# Patient Record
Sex: Female | Born: 1960 | Race: White | Hispanic: No | Marital: Married | Smoking: Never smoker
Health system: Southern US, Academic
[De-identification: ages and names within clinical notes are randomized; demographics above are authoritative.]

## PROBLEM LIST (undated history)

## (undated) DIAGNOSIS — K219 Gastro-esophageal reflux disease without esophagitis: Secondary | ICD-10-CM

## (undated) DIAGNOSIS — K449 Diaphragmatic hernia without obstruction or gangrene: Secondary | ICD-10-CM

## (undated) DIAGNOSIS — G4730 Sleep apnea, unspecified: Secondary | ICD-10-CM

## (undated) DIAGNOSIS — E559 Vitamin D deficiency, unspecified: Secondary | ICD-10-CM

## (undated) DIAGNOSIS — E782 Mixed hyperlipidemia: Secondary | ICD-10-CM

## (undated) HISTORY — DX: Vitamin D deficiency, unspecified: E55.9

## (undated) HISTORY — DX: Gastro-esophageal reflux disease without esophagitis: K21.9

## (undated) HISTORY — DX: Mixed hyperlipidemia: E78.2

## (undated) HISTORY — PX: ENDOMETRIAL ABLATION: SHX621

## (undated) HISTORY — DX: Diaphragmatic hernia without obstruction or gangrene: K44.9

## (undated) HISTORY — PX: UPPER GASTROINTESTINAL ENDOSCOPY: SHX188

## (undated) HISTORY — PX: HX TUBAL LIGATION: SHX77

## (undated) HISTORY — PX: HX ROTATOR CUFF REPAIR: SHX139

## (undated) HISTORY — PX: COLONOSCOPY: WVUENDOPRO10

## (undated) HISTORY — DX: Sleep apnea, unspecified: G47.30

---

## 2003-01-04 ENCOUNTER — Other Ambulatory Visit (HOSPITAL_COMMUNITY): Payer: Self-pay | Admitting: Family Medicine

## 2021-04-21 ENCOUNTER — Other Ambulatory Visit: Payer: Self-pay

## 2021-04-21 ENCOUNTER — Ambulatory Visit (INDEPENDENT_AMBULATORY_CARE_PROVIDER_SITE_OTHER): Payer: 59 | Admitting: NURSE PRACTITIONER

## 2021-04-21 ENCOUNTER — Encounter (INDEPENDENT_AMBULATORY_CARE_PROVIDER_SITE_OTHER): Payer: Self-pay | Admitting: NURSE PRACTITIONER

## 2021-04-21 VITALS — BP 146/72 | HR 71 | Temp 96.4°F | Ht 69.0 in | Wt 212.6 lb

## 2021-04-21 DIAGNOSIS — R718 Other abnormality of red blood cells: Secondary | ICD-10-CM

## 2021-04-21 NOTE — H&P (Signed)
Department of Hematology/Oncology  History and Physical    Name: Cynthia Villanueva  [id number]  Date of Birth: 09/22/1960  Encounter Date: 04/21/2021    REFERRING PROVIDER:  Jodean Lima, [place],  [place] [address]    REASON FOR OFFICE VISIT:  New patient for evaluation and management of  Elevated RBC's and Hematocrit.     HISTORY OF PRESENT ILLNESS:  Cynthia Villanueva is a 61 y.o. female who presents today for elevated RBC's and Hematocrit. Patient states that this has been an issue for over 10 years now. She was diagnosed with Sleep Apnea back in March 2022 and is now using a CPAP. She states that she has chronic back pain and had a bad spider bite a few months ago. She states that she is on a fixed income and since she is having no symptoms and this has been an issue for over 10 years. She is asking if she "really needs to have further testing completed." I discussed with the patient the possible causes of these abnormal labs and recommendations to have testing completed. The patient offers no complaints at this time.  The patient has had a good appetite and stable weight.  The patient has not noted any new areas of disease on own self exam.  The patient has not had any chest pain or dyspnea.  The patient has not had any headaches or changes in vision.  The patient has not had any abdominal pain, nausea, or vomiting.  There have been no changes in bowel or bladder habits.  The patient has not had any abnormal bleeding or clotting episodes.  There have been no complaints of fever, chills, cough, sputum production, dysuria, or diarrhea.    ROS:   Review of Systems   Constitutional: Negative.  Negative for appetite change.   HENT:  Negative.    Eyes: Negative.    Respiratory: Negative.  Negative for shortness of breath.    Cardiovascular: Negative.  Negative for chest pain.   Gastrointestinal: Negative.  Negative for abdominal pain, diarrhea, nausea and vomiting.   Genitourinary: Negative.  Negative  for difficulty urinating.    Musculoskeletal: Negative.    Skin: Negative.    Neurological: Negative.    Hematological: Negative.    Psychiatric/Behavioral: Negative.         History:  History reviewed. No pertinent past medical history.      Past Surgical History:   Procedure Laterality Date   . CESAREAN SECTION     . HX ROTATOR CUFF REPAIR Right    . HX TUBAL LIGATION             Social History     Socioeconomic History   . Marital status: Married     Spouse name: Not on file   . Number of children: Not on file   . Years of education: Not on file   . Highest education level: Not on file   Occupational History   . Not on file   Tobacco Use   . Smoking status: Never   . Smokeless tobacco: Never   Vaping Use   . Vaping Use: Never used   Substance and Sexual Activity   . Alcohol use: Never   . Drug use: Never   . Sexual activity: Not on file   Other Topics Concern   . Not on file   Social History Narrative   . Not on file     Social Determinants of  Health     Financial Resource Strain: Not on file   Food Insecurity: Not on file   Transportation Needs: Not on file   Physical Activity: Not on file   Stress: Not on file   Intimate Partner Violence: Not on file   Housing Stability: Not on file       Social History     Social History Narrative   . Not on file       Social History     Substance and Sexual Activity   Drug Use Never       Family Medical History:     Problem Relation (Age of Onset)    Bone cancer Father    Congestive Heart Failure Maternal Grandmother    Diabetes Maternal Grandmother    Leukemia Mother    Lung Cancer Father            Current Outpatient Medications   Medication Sig   . ascorbic acid (VITAMIN C) 1,000 mg Oral Tablet Take 1 Tablet (1,000 mg total) by mouth Once a day   . calcium carbonate (CALCIUM 600 ORAL) Take by mouth   . cholecalciferol, vitamin D3, (VITAMIN D-3) 50 mcg (2,000 unit) Oral Tablet Take 1 Tablet (2,000 Units total) by mouth Once a day   . famotidine (PEPCID) 40 mg Oral Tablet  Take 1 Tablet (40 mg total) by mouth Once a day   . meloxicam (MOBIC) 15 mg Oral Tablet TAKE 1/2 (ONE-HALF) TABLET BY MOUTH TWICE DAILY AS NEEDED   . multivitamin-minerals-lutein (MULTIVITAMIN 50 PLUS) Oral Tablet Take 1 Tablet by mouth Once a day   . pantoprazole (PROTONIX) 40 mg Oral Tablet, Delayed Release (E.C.) Take 1 Tablet (40 mg total) by mouth Every other day   . pravastatin (PRAVACHOL) 20 mg Oral Tablet Take 1 Tablet (20 mg total) by mouth Once a day       Allergies   Allergen Reactions   . Penicillins Rash         PHYSICAL EXAM:  Most Recent IT sales professional Row Office Visit from 04/21/2021 in Hematology/Oncology, [place], [place]    Temperature 35.8 C (96.4 F) filed at... 04/21/2021 1323   Heart Rate 71 filed at... 04/21/2021 1323   Respiratory Rate --   BP (Non-Invasive) 146/72 filed at... 04/21/2021 1323   SpO2 --   Height 1.753 m (5\' 9" ) filed at... 04/21/2021 1323   Weight 96.4 kg (212 lb 9.6 oz) filed at... 04/21/2021 1323   BMI (Calculated) 31.46 filed at... 04/21/2021 1323   BSA (Calculated) 2.17 filed at... 04/21/2021 1323      ECOG Status: (0) Fully active, able to carry on all predisease performance without restriction   Physical Exam  Vitals and nursing note reviewed.   Constitutional:       Appearance: Normal appearance. She is normal weight.   HENT:      Head: Normocephalic.      Nose: Nose normal.      Mouth/Throat:      Mouth: Mucous membranes are moist.      Pharynx: Oropharynx is clear.   Eyes:      General: No scleral icterus.     Extraocular Movements: Extraocular movements intact.   Cardiovascular:      Rate and Rhythm: Normal rate and regular rhythm.      Pulses: Normal pulses.      Heart sounds: Normal heart sounds.   Pulmonary:  Effort: Pulmonary effort is normal.      Breath sounds: Normal breath sounds.   Abdominal:      General: Bowel sounds are normal.      Palpations: Abdomen is soft.   Musculoskeletal:         General: Normal  range of motion.      Cervical back: Normal range of motion and neck supple.   Skin:     General: Skin is warm and dry.   Neurological:      General: No focal deficit present.      Mental Status: She is alert and oriented to person, place, and time. Mental status is at baseline.   Psychiatric:         Mood and Affect: Mood normal.         Behavior: Behavior normal.         Thought Content: Thought content normal.         Judgment: Judgment normal.          LABS:   Labs completed on 03/10/2021: WBC 6.3, RBC 5.77, HGB 15.4, HCT 47.6, PLT [id number].    ASSESSMENT:    ICD-10-CM    1. Elevated hematocrit  R71.8 COMPREHENSIVE METABOLIC PANEL, NON-FASTING     CBC/DIFF     C-REACTIVE PROTEIN(CRP),INFLAMMATION     JAK2 V617F CASCADING REFL CALR,JAK2 EXON12,MPL,CSF3R      2. Elevated red blood cell count  R71.8 COMPREHENSIVE METABOLIC PANEL, NON-FASTING     CBC/DIFF     C-REACTIVE PROTEIN(CRP),INFLAMMATION     JAK2 V617F CASCADING REFL CALR,JAK2 EXON12,MPL,CSF3R             PLAN:   1. All relative external and internal medical records were reviewed including available H&Ps, progress notes, procedure notes, imaging's, laboratories, and pathology.   2. All labs from last lab studies were reviewed with the patient including CBC/differential, CMP, LFTs. Details of exam finding's discussed.   3. Patient is agreeable to have CBC, CRP, and JAK-2 completed. She has requested to not have a CMP or any other testing at this time.      Arayiah Mancill was given the chance to ask questions, and these were answered to their satisfaction. The patient is welcome to call with any questions or concerns in the meantime.     Return in about 4 weeks (around 05/19/2021).     Kathrin Penner, FNP-BC  04/21/2021, 13:58    CC:  Jodean Lima, MD  [address] [place] [address]    [place], [place], [place],  [place] [address]      This note was partially generated using MModal Fluency Direct system, and there may be some incorrect words,  spellings, and punctuation that were not noted in checking the note before saving.

## 2021-05-26 ENCOUNTER — Ambulatory Visit (INDEPENDENT_AMBULATORY_CARE_PROVIDER_SITE_OTHER): Payer: 59 | Admitting: Family

## 2021-05-26 ENCOUNTER — Other Ambulatory Visit: Payer: Self-pay

## 2021-05-26 ENCOUNTER — Encounter (INDEPENDENT_AMBULATORY_CARE_PROVIDER_SITE_OTHER): Payer: Self-pay | Admitting: Family

## 2021-05-26 VITALS — BP 136/77 | HR 78 | Temp 98.0°F | Ht 69.0 in | Wt 209.8 lb

## 2021-05-26 DIAGNOSIS — Z9989 Dependence on other enabling machines and devices: Secondary | ICD-10-CM

## 2021-05-26 DIAGNOSIS — D751 Secondary polycythemia: Secondary | ICD-10-CM

## 2021-05-26 DIAGNOSIS — G4730 Sleep apnea, unspecified: Secondary | ICD-10-CM

## 2021-05-27 ENCOUNTER — Encounter (INDEPENDENT_AMBULATORY_CARE_PROVIDER_SITE_OTHER): Payer: Self-pay | Admitting: Family

## 2021-05-27 NOTE — Cancer Center Note (Addendum)
Department of Hematology/Oncology  Progress Note    Name: Cynthia Villanueva  KKX:[id number]  Date of Birth: 10/17/1960  Encounter Date: 05/26/2021    REFERRING PROVIDER:  Sherilyn Dacosta, MD  [address]  [place],  [place] [address]    REASON FOR OFFICE VISIT:  Ongoing evaluation and management of erythrocytosis    HISTORY OF PRESENT ILLNESS:  Cynthia Villanueva is a 61 y.o. female who presents today due to erythrocytosis.Patient states that she has been compliant with using her CPAP for her sleep apnea. She denies any other complaints.  The patient has had a good appetite and stable weight. The patient has not had any chest pain or dyspnea.  The patient has not had any headaches or changes in vision.  The patient has not had any abdominal pain, nausea, or vomiting.  There have been no changes in bowel or bladder habits.  The patient has not had any abnormal bleeding or clotting episodes.  There have been no complaints of fever, chills, cough, sputum production, dysuria, or diarrhea.    ROS:   Review of Systems   All other systems reviewed and are negative.       History:  History reviewed. No pertinent past medical history.      Past Surgical History:   Procedure Laterality Date   . CESAREAN SECTION     . HX ROTATOR CUFF REPAIR Right    . HX TUBAL LIGATION         Social History     Socioeconomic History   . Marital status: Married     Spouse name: Not on file   . Number of children: Not on file   . Years of education: Not on file   . Highest education level: Not on file   Occupational History   . Not on file   Tobacco Use   . Smoking status: Never   . Smokeless tobacco: Never   Vaping Use   . Vaping Use: Never used   Substance and Sexual Activity   . Alcohol use: Never   . Drug use: Never   . Sexual activity: Not on file   Other Topics Concern   . Not on file   Social History Narrative   . Not on file     Social Determinants of Health     Financial Resource Strain: Not on file   Transportation Needs: Not on file   Social  Connections: Not on file   Intimate Partner Violence: Not on file   Housing Stability: Not on file       Social History     Social History Narrative   . Not on file       Social History     Substance and Sexual Activity   Drug Use Never       Family Medical History:     Problem Relation (Age of Onset)    Bone cancer Father    Congestive Heart Failure Maternal Grandmother    Diabetes Maternal Grandmother    Leukemia Mother    Lung Cancer Father        Current Outpatient Medications   Medication Sig   . ascorbic acid (VITAMIN C) 1,000 mg Oral Tablet Take 1 Tablet (1,000 mg total) by mouth Once a day   . calcium carbonate (CALCIUM 600 ORAL) Take by mouth   . cholecalciferol, vitamin D3, 50 mcg (2,000 unit) Oral Tablet Take 1 Tablet (2,000 Units total) by mouth Once a day   .  famotidine (PEPCID) 40 mg Oral Tablet Take 1 Tablet (40 mg total) by mouth Once a day   . meloxicam (MOBIC) 15 mg Oral Tablet TAKE 1/2 (ONE-HALF) TABLET BY MOUTH TWICE DAILY AS NEEDED   . multivitamin-minerals-lutein (MULTIVITAMIN 50 PLUS) Oral Tablet Take 1 Tablet by mouth Once a day   . pantoprazole (PROTONIX) 40 mg Oral Tablet, Delayed Release (E.C.) Take 1 Tablet (40 mg total) by mouth Every other day   . pravastatin (PRAVACHOL) 20 mg Oral Tablet Take 1 Tablet (20 mg total) by mouth Once a day       Allergies   Allergen Reactions   . Penicillins Rash       VITAL SIGNS:  Most Recent Vitals    Flowsheet Row Office Visit from 04/21/2021 in Hematology/Oncology, [facility], [facility]    Temperature 35.8 C (96.4 F) filed at... 04/21/2021 1323   Heart Rate 71 filed at... 04/21/2021 1323   Respiratory Rate --   BP (Non-Invasive) 146/72 filed at... 04/21/2021 1323   SpO2 --   Height 1.753 m (5\' 9" ) filed at... 04/21/2021 1323   Weight 96.4 kg (212 lb 9.6 oz) filed at... 04/21/2021 1323   BMI (Calculated) 31.46 filed at... 04/21/2021 1323   BSA (Calculated) 2.17 filed at... 04/21/2021 1323      ECOG Status: (0)  Fully active, able to carry on all predisease performance without restriction       Physical Exam  Vitals and nursing note reviewed.   Constitutional:       Appearance: Normal appearance. She is normal weight.   HENT:      Head: Normocephalic.      Nose: Nose normal.      Mouth/Throat:      Mouth: Mucous membranes are moist.      Pharynx: Oropharynx is clear.   Eyes:      General: No scleral icterus.     Extraocular Movements: Extraocular movements intact.   Cardiovascular:      Rate and Rhythm: Normal rate and regular rhythm.      Pulses: Normal pulses.      Heart sounds: Normal heart sounds, S1 normal and S2 normal.   Pulmonary:      Effort: Pulmonary effort is normal.      Breath sounds: Normal breath sounds.   Abdominal:      General: Bowel sounds are normal.      Palpations: Abdomen is soft.   Musculoskeletal:         General: Normal range of motion.      Cervical back: Normal range of motion and neck supple. No bony tenderness.      Thoracic back: No bony tenderness.      Lumbar back: No bony tenderness.   Lymphadenopathy:      Cervical: No cervical adenopathy.      Comments: No supraclavicular adenopathy   Skin:     General: Skin is warm and dry.   Neurological:      General: No focal deficit present.      Mental Status: She is alert and oriented to person, place, and time. Mental status is at baseline.      Motor: Motor function is intact.      Coordination: Coordination is intact.      Gait: Gait is intact.   Psychiatric:         Mood and Affect: Mood and affect normal.         Speech:  Speech normal.         Behavior: Behavior normal. Behavior is cooperative.         Thought Content: Thought content normal.         Cognition and Memory: Cognition and memory normal.         Judgment: Judgment normal.          LABS:   Labs completed on 04/25/2021: WBC 5.8, RBC 5.67, HGB 15.2, HCT 46.5, PLT 244,000.  JAK2 mutation was negative    ASSESSMENT:    ICD-10-CM    1. Polycythemia  D75.1 CBC/DIFF           PLAN:   1.  All relative external and internal medical records were reviewed including available H&Ps, progress notes, procedure notes, imaging's, laboratories, and pathology.   2. All labs from last lab studies were reviewed with the patient. Details of exam finding's discussed.   3.  Reviewed with patient the importance of being compliant with CPAP and that using the CPAP is slowly improving her erythrocytosis.  She voiced understanding.  We will continue to monitor counts for now.       Cynthia Villanueva was given the chance to ask questions, and these were answered to their satisfaction. The patient is welcome to call with any questions or concerns in the meantime.     Return in about 2 months (around 07/31/2021).     Maxtyn Nuzum ARPN, FNP-C    CC:  Sherilyn Dacosta, MD  [address]  [place] [address]        This note was partially generated using MModal Fluency Direct system, and there may be some incorrect words, spellings, and punctuation that were not noted in checking the note before saving.

## 2021-07-29 ENCOUNTER — Ambulatory Visit: Payer: 59 | Attending: Family | Admitting: Family

## 2021-07-29 ENCOUNTER — Other Ambulatory Visit: Payer: Self-pay

## 2021-07-29 ENCOUNTER — Encounter (INDEPENDENT_AMBULATORY_CARE_PROVIDER_SITE_OTHER): Payer: Self-pay | Admitting: Family

## 2021-07-29 VITALS — BP 128/64 | HR 85 | Temp 98.0°F | Ht 69.0 in | Wt 209.6 lb

## 2021-07-29 DIAGNOSIS — Z808 Family history of malignant neoplasm of other organs or systems: Secondary | ICD-10-CM | POA: Insufficient documentation

## 2021-07-29 DIAGNOSIS — Z806 Family history of leukemia: Secondary | ICD-10-CM | POA: Insufficient documentation

## 2021-07-29 DIAGNOSIS — Z801 Family history of malignant neoplasm of trachea, bronchus and lung: Secondary | ICD-10-CM | POA: Insufficient documentation

## 2021-07-29 DIAGNOSIS — D751 Secondary polycythemia: Secondary | ICD-10-CM | POA: Insufficient documentation

## 2021-07-29 NOTE — Cancer Center Note (Signed)
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 female who presents today due to erythrocytosis.Patient states that she has been compliant with using her CPAP for her sleep apnea. She denies any other complaints.        ROS:   Review of Systems   All other systems reviewed and are negative.     Past Medical History:   Diagnosis Date   . GERD (gastroesophageal reflux disease)    . Mixed hyperlipidemia    . Sleep apnea    . Vitamin D deficiency      Past Surgical History:   Procedure Laterality Date   . CESAREAN SECTION     . HX ROTATOR CUFF REPAIR Right    . HX TUBAL LIGATION       Social History     Socioeconomic History   . Marital status: Married     Spouse name: Not on file   . Number of children: Not on file   . Years of education: Not on file   . Highest education level: Not on file   Occupational History   . Not on file   Tobacco Use   . Smoking status: Never   . Smokeless tobacco: Never   Vaping Use   . Vaping Use: Never used   Substance and Sexual Activity   . Alcohol use: Never   . Drug use: Never   . Sexual activity: Not on file   Other Topics Concern   . Not on file   Social History Narrative   . Not on file     Social Determinants of Health     Financial Resource Strain: Not on file   Transportation Needs: Not on file   Social Connections: Not on file   Intimate Partner Violence: Not on file   Housing Stability: Not on file     Social History     Social History Narrative   . Not on file     Social History     Substance and Sexual Activity   Drug Use Never     Family Medical History:     Problem Relation (Age of Onset)    Bone cancer Father    Congestive Heart Failure Maternal Grandmother     Diabetes Maternal Grandmother    Leukemia Mother    Lung Cancer Father        Current Outpatient Medications   Medication Sig   . ascorbic acid (VITAMIN C) 1,000 mg Oral Tablet Take 1 Tablet (1,000 mg total) by mouth Once a day   . calcium carbonate (CALCIUM 600 ORAL) Take by mouth   . cholecalciferol, vitamin D3, 50 mcg (2,000 unit) Oral Tablet Take 1 Tablet (2,000 Units total) by mouth Once a day   . famotidine (PEPCID) 40 mg Oral Tablet Take 1 Tablet (40 mg total) by mouth Once a day   . meloxicam (MOBIC) 15 mg Oral Tablet TAKE 1/2 (ONE-HALF) TABLET BY MOUTH TWICE DAILY AS NEEDED   . multivitamin-minerals-lutein (MULTIVITAMIN 50 PLUS) Oral Tablet Take 1 Tablet by mouth Once a day   . pantoprazole (PROTONIX) 40 mg Oral Tablet, Delayed  Release (E.C.) Take 1 Tablet (40 mg total) by mouth Every other day   . pravastatin (PRAVACHOL) 20 mg Oral Tablet Take 1 Tablet (20 mg total) by mouth Once a day     Allergies   Allergen Reactions   . Penicillins Rash     VITAL SIGNS:  Most Recent Vitals    Flowsheet Row Office Visit from 04/21/2021 in Hematology/Oncology, [facility], [place]    Temperature 35.8 C (96.4 F) filed at... 04/21/2021 1323   Heart Rate 71 filed at... 04/21/2021 1323   Respiratory Rate --   BP (Non-Invasive) 146/72 filed at... 04/21/2021 1323   SpO2 --   Height 1.753 m (5\' 9" ) filed at... 04/21/2021 1323   Weight 96.4 kg (212 lb 9.6 oz) filed at... 04/21/2021 1323   BMI (Calculated) 31.46 filed at... 04/21/2021 1323   BSA (Calculated) 2.17 filed at... 04/21/2021 1323      ECOG Status: (0) Fully active, able to carry on all predisease performance without restriction.    Physical Exam  Vitals and nursing note reviewed.   Constitutional:       Appearance: Normal appearance. She is normal weight.   HENT:      Head: Normocephalic.      Nose: Nose normal.      Mouth/Throat:      Mouth: Mucous membranes are moist.      Pharynx: Oropharynx is clear.   Eyes:      General:  No scleral icterus.     Extraocular Movements: Extraocular movements intact.   Cardiovascular:      Rate and Rhythm: Normal rate and regular rhythm.      Pulses: Normal pulses.      Heart sounds: Normal heart sounds, S1 normal and S2 normal.   Pulmonary:      Effort: Pulmonary effort is normal.      Breath sounds: Normal breath sounds.   Abdominal:      General: Bowel sounds are normal.      Palpations: Abdomen is soft.   Musculoskeletal:         General: Normal range of motion.      Cervical back: Normal range of motion and neck supple. No bony tenderness.      Thoracic back: No bony tenderness.      Lumbar back: No bony tenderness.   Lymphadenopathy:      Cervical: No cervical adenopathy.      Comments: No supraclavicular adenopathy   Skin:     General: Skin is warm and dry.   Neurological:      General: No focal deficit present.      Mental Status: She is alert and oriented to person, place, and time. Mental status is at baseline.      Motor: Motor function is intact.      Coordination: Coordination is intact.      Gait: Gait is intact.   Psychiatric:         Mood and Affect: Mood and affect normal.         Speech: Speech normal.         Behavior: Behavior normal. Behavior is cooperative.         Thought Content: Thought content normal.         Cognition and Memory: Cognition and memory normal.         Judgment: Judgment normal.          LABS:   Labs from 07/09/21:  WBC 5.8, Hgb 15.4, Hct 46.8, RBC 5.64, Plt 236 K    ASSESSMENT:    ICD-10-CM    1. Polycythemia  D75.1 CBC/DIFF     ERYTHROPOIETIN (EPO), SERUM           PLAN:   1. All relative external and internal medical records were reviewed including available H&Ps, progress notes, procedure notes, imaging's, laboratories, and pathology.   2. All labs from last lab studies were reviewed with the patient. Details of exam finding's discussed.   3.  Reviewed with patient the importance of being compliant with CPAP.  She voiced understanding.  We will continue to  monitor counts for now.       Cynthia Villanueva was given the chance to ask questions, and these were answered to their satisfaction. The patient is welcome to call with any questions or concerns in the meantime.     Return in about 3 months (around 10/28/2021) for In Person Visit.     Cynthia Villanueva ARPN, FNP-C    CC:  Cynthia D [place], DO  [address]  [place] [address]        This note was partially generated using MModal Fluency Direct system, and there may be some incorrect words, spellings, and punctuation that were not noted in checking the note before saving.

## 2021-07-31 ENCOUNTER — Ambulatory Visit (INDEPENDENT_AMBULATORY_CARE_PROVIDER_SITE_OTHER): Payer: Self-pay | Admitting: Hematology & Oncology

## 2021-10-28 ENCOUNTER — Ambulatory Visit (HOSPITAL_COMMUNITY): Payer: Self-pay | Admitting: NURSE PRACTITIONER

## 2021-10-28 ENCOUNTER — Ambulatory Visit: Payer: 59 | Attending: HEMATOLOGY-ONCOLOGY | Admitting: HEMATOLOGY-ONCOLOGY

## 2021-10-28 ENCOUNTER — Encounter (HOSPITAL_COMMUNITY): Payer: Self-pay | Admitting: HEMATOLOGY-ONCOLOGY

## 2021-10-28 ENCOUNTER — Other Ambulatory Visit: Payer: Self-pay

## 2021-10-28 VITALS — BP 150/70 | HR 66 | Temp 98.4°F | Ht 69.0 in | Wt 213.8 lb

## 2021-10-28 DIAGNOSIS — Z808 Family history of malignant neoplasm of other organs or systems: Secondary | ICD-10-CM | POA: Insufficient documentation

## 2021-10-28 DIAGNOSIS — D751 Secondary polycythemia: Secondary | ICD-10-CM | POA: Insufficient documentation

## 2021-10-28 DIAGNOSIS — Z806 Family history of leukemia: Secondary | ICD-10-CM | POA: Insufficient documentation

## 2021-10-28 DIAGNOSIS — Z801 Family history of malignant neoplasm of trachea, bronchus and lung: Secondary | ICD-10-CM | POA: Insufficient documentation

## 2021-10-28 DIAGNOSIS — G4730 Sleep apnea, unspecified: Secondary | ICD-10-CM | POA: Insufficient documentation

## 2021-10-28 DIAGNOSIS — E611 Iron deficiency: Secondary | ICD-10-CM

## 2021-10-28 NOTE — Progress Notes (Signed)
Department of Hematology/Oncology  Progress Note   Name: Cynthia Villanueva  NBV:[id number]  Date of Birth: [birth year]  Encounter Date: 10/28/2021    REFERRING PROVIDER:  Ronnie Derby D, DO  [place],  [place] [address]    REASON FOR OFFICE VISIT:  Polycythemia Vera       HISTORY OF PRESENT ILLNESS:  Cynthia Villanueva is a 61 y.o. female who presents today for follow up of polycythemia.  She has a history of sleep apnea and uses a CPAP machine.  In reviewing her history, the CBC is over the last several years have typically shown her hemoglobin level to range between 14 and 17.1.    ROS:   Pertinent review of systems as discussed in HPI    HISTORY:  Past Medical History:   Diagnosis Date    GERD (gastroesophageal reflux disease)     Mixed hyperlipidemia     Sleep apnea     Vitamin D deficiency          Past Surgical History:   Procedure Laterality Date    CESAREAN SECTION      HX ROTATOR CUFF REPAIR Right     HX TUBAL LIGATION           Social History     Socioeconomic History    Marital status: Married     Spouse name: Not on file    Number of children: Not on file    Years of education: Not on file    Highest education level: Not on file   Occupational History    Not on file   Tobacco Use    Smoking status: Never    Smokeless tobacco: Never   Vaping Use    Vaping Use: Never used   Substance and Sexual Activity    Alcohol use: Never    Drug use: Never    Sexual activity: Not on file   Other Topics Concern    Not on file   Social History Narrative    Not on file     Social Determinants of Health     Financial Resource Strain: Not on file   Transportation Needs: Not on file   Social Connections: Not on file   Intimate Partner Violence: Not on file   Housing Stability: Not on file     Family Medical History:       Problem Relation (Age of Onset)    Bone cancer Father    Congestive Heart Failure Maternal Grandmother    Diabetes Maternal Grandmother    Leukemia Mother    Lung Cancer Father            Current  Outpatient Medications   Medication Sig    ascorbic acid (VITAMIN C) 1,000 mg Oral Tablet Take 1 Tablet (1,000 mg total) by mouth Once a day    calcium carbonate (CALCIUM 600 ORAL) Take by mouth    cholecalciferol, vitamin D3, 50 mcg (2,000 unit) Oral Tablet Take 1 Tablet (2,000 Units total) by mouth Once a day    famotidine (PEPCID) 40 mg Oral Tablet Take 1 Tablet (40 mg total) by mouth Once a day    meloxicam (MOBIC) 15 mg Oral Tablet TAKE 1/2 (ONE-HALF) TABLET BY MOUTH TWICE DAILY AS NEEDED    multivitamin-minerals-lutein (MULTIVITAMIN 50 PLUS) Oral Tablet Take 1 Tablet by mouth Once a day    pantoprazole (PROTONIX) 40 mg Oral Tablet, Delayed Release (E.C.) Take 1 Tablet (40 mg total) by mouth Every other  day    pravastatin (PRAVACHOL) 20 mg Oral Tablet Take 1 Tablet (20 mg total) by mouth Once a day     Allergies   Allergen Reactions    Penicillins Rash       PHYSICAL EXAM:  Most Recent [place] Office Visit from 10/28/2021 in Hematology/Oncology,   [facility]   Temperature 36.9 C (98.4 F) filed at... 10/28/2021 1102   Heart Rate 66 filed at... 10/28/2021 1102   Respiratory Rate --   BP (Non-Invasive) 150/70 filed at... 10/28/2021 1102   SpO2 --   Height 1.753 m (_0 ) filed at... 10/28/2021 1102   Weight 97 kg (213 lb 12.8 oz) filed at... 10/28/2021 1102   BMI (Calculated) 31.64 filed at... 10/28/2021 1102   BSA (Calculated) 2.17 filed at... 10/28/2021 1102      ECOG Status: (0) Fully active, able to carry on all predisease performance without restriction   Physical Exam  Constitutional:       General: She is not in acute distress.     Appearance: Normal appearance.   Eyes:      Extraocular Movements: Extraocular movements intact.   Cardiovascular:      Rate and Rhythm: Normal rate and regular rhythm.   Pulmonary:      Effort: Pulmonary effort is normal.   Abdominal:      General: Abdomen is flat.      Palpations: Abdomen is soft.   Musculoskeletal:         General: Normal  range of motion.      Cervical back: Normal range of motion.   Skin:     General: Skin is warm and dry.   Neurological:      General: No focal deficit present.      Mental Status: She is alert.   Psychiatric:         Mood and Affect: Mood normal.       DIAGNOSTIC DATA:  No results found for this or any previous visit (from the past 17520 hour(s)).    LABS:   CBC  Diff   No results found for: WBC, WBCJ, HGB, HCT, PLTCNT, SEDRATE, ESR, RBC, MCV, MCHC, MCH, RDW, MPV No results found for: PMNS, LYMPHOCYTES, EOSINOPHIL, MONOCYTES, BASOPHILS, PMNABS, LYMPHSABS, EOSABS, MONOSABS, BASOSABS, BASABS         Comprehensive Metabolic Profile    No results found for: SODIUM, POTASSIUM, CHLORIDE, CO2, ANIONGAP, BUN, CREATININE, ALBUMIN, CALCIUM, GLUCOSENF, GLUCOSEFAST, ALKPHOS, ALT, AST, TOTBILIRUBIN, TOTALPROTEIN       BASIC METABOLIC PANEL  No results found for: SODIUM, POTASSIUM, CHLORIDE, CO2, ANIONGAP, BUN, CREATININE, BUNCRRATIO, GFR, CALCIUM, GLUCOSENF        ASSESSMENT:  Problem List Items Addressed This Visit    None     No diagnosis found.     PLAN:   1. All relevant medical records were reviewed including available pertinent provider notes, procedure notes, imaging, laboratory, and pathology.   2. All pertinent labs and/or imaging were reviewed with the patient.   3. Polycythemia:  Her CBC has been very close to normal range, so I did not have any significant concern for an underlying myeloproliferative disorder.  With her history of sleep apnea, it is possible that this could be the main contributing factor to an occasional elevation but it has never been elevated enough to cause concern from my standpoint.  4. Probable iron-deficiency:  The patient's MCV has ranged from 85 down to 83.  Eighty-three in  my experience is consistent with probable iron-deficiency.  She states that she is up-to-date on having colonoscopy exams performed and the deficiency may be related to her regular blood donation.  I have advised her to  take an oral iron supplement once a day.  5. Disposition:  She can see [place] on an as-needed basis going forward    Leasia Swann was given the chance to ask questions, and these were answered to their satisfaction. The patient is welcome to call with any questions or concerns in the meantime.     On the day of the encounter, a total of 35 minutes was spent on this patient encounter including review of historical information, examination, documentation and post-visit activities.   Return if symptoms worsen or fail to improve.     Narda Rutherford, MD  10/28/2021 , 13:07    CC:  Garnette Scheuermann, DO  [place] [address]    [place], [place], [place],  [place] [address]    This note was partially generated using MModal Fluency Direct system, and there may be some incorrect words, spellings, and punctuation that were not noted in checking the note before saving.

## 2021-12-09 ENCOUNTER — Other Ambulatory Visit (HOSPITAL_COMMUNITY): Payer: Self-pay | Admitting: FAMILY PRACTICE

## 2021-12-09 DIAGNOSIS — M25572 Pain in left ankle and joints of left foot: Secondary | ICD-10-CM

## 2021-12-16 ENCOUNTER — Other Ambulatory Visit (HOSPITAL_COMMUNITY): Payer: 59

## 2021-12-18 IMAGING — MR MRI ANKLE LT W WO CONTRAST
9 of 11 series · 33 of 40 positions shown · IV contrast (Gadavist)
Comparison: No prior imaging studies of the ankle are available for comparison.

﻿EXAM:  [id number]   MRI ANKLE LT W WO CONTRAST
INDICATION: 60-year-old with posterior ankle pain for a few months. Burning and swelling.  Diagnosis of tendinitis.  No known history of trauma or previous surgery.
TECHNIQUE: Multiplanar, multisequential MRI of the left ankle was performed, including postcontrast study after injection of 10 mL Gadavist IV.

[Series 7: s-map · sagittal · 3.8mm · 3.75mm/px · 7 of 62 slices shown]
[im 1/62]
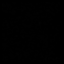
[im 11/62]
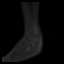
[im 21/62]
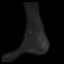
[im 31/62]
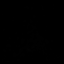
[im 41/62]
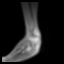
[im 51/62]
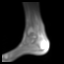
[im 62/62]
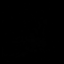

[Series 8: T1 · sagittal · 3.0mm · 0.38mm/px · 2 of 20 slices shown (1 of 3)]
[im 1/20]
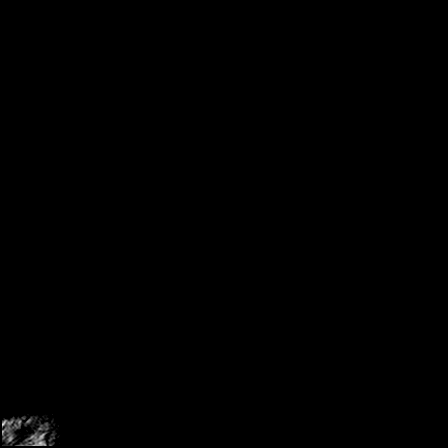
[im 20/20]
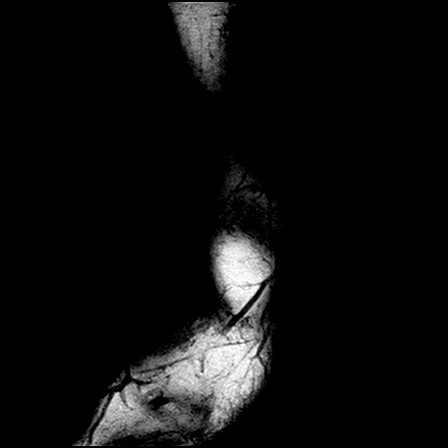

[Series 9: T2 fat-sat · sagittal · 3.0mm · 0.38mm/px · 2 of 20 slices shown (1 of 2)]
[im 1/20]
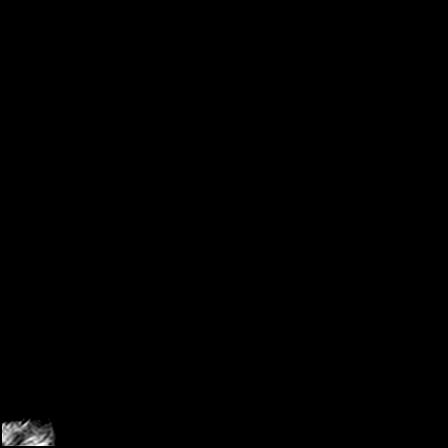
[im 20/20]
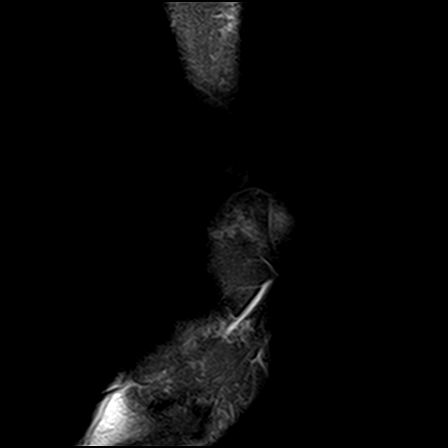

[Series 10: T1 · coronal · 4.0mm · 0.33mm/px · 3 of 26 slices shown (2 of 3)]
[im 1/26]
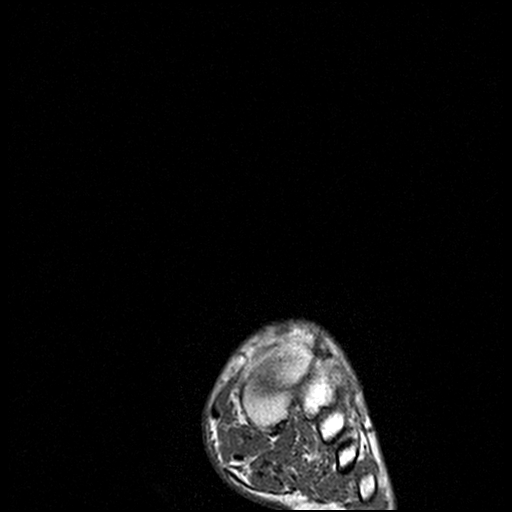
[im 13/26]
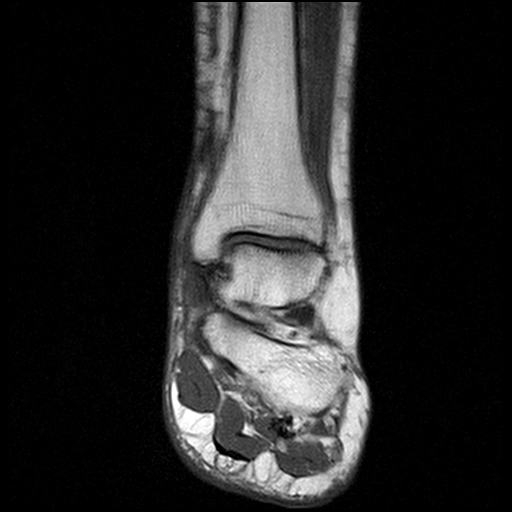
[im 26/26]
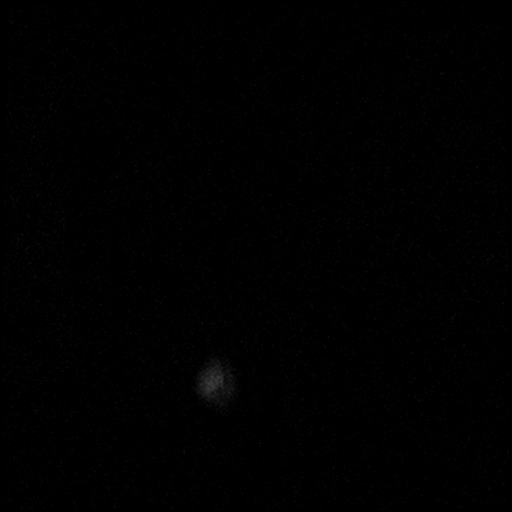

[Series 12: T1 · axial · 5.0mm · 0.33mm/px · z∈[-80,+67]mm · 4 of 28 slices shown (3 of 3)]
[im 1/28]
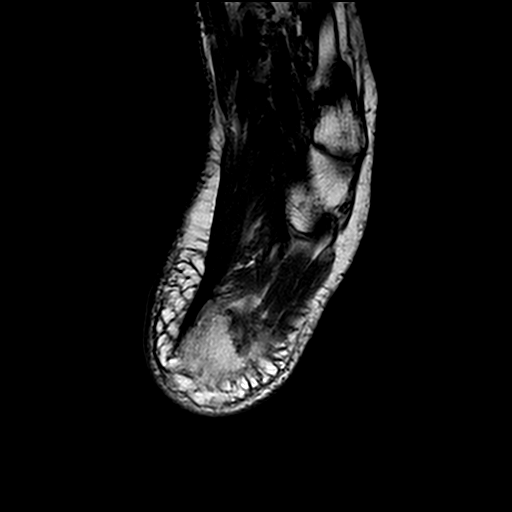
[im 10/28]
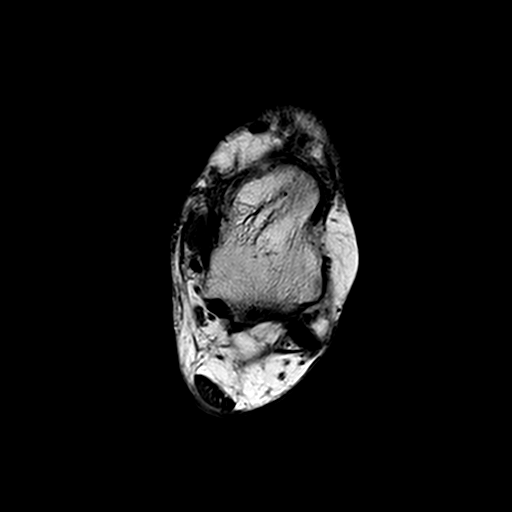
[im 19/28]
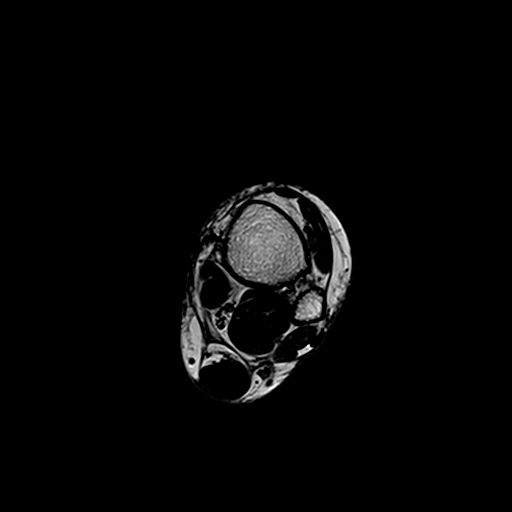
[im 28/28]
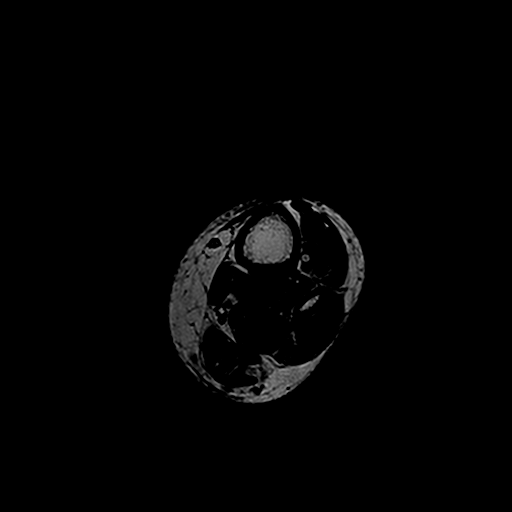

[Series 13: T2 fat-sat · axial · 5.0mm · 0.38mm/px · z∈[-80,+67]mm · 4 of 28 slices shown (2 of 2)]
[im 1/28]
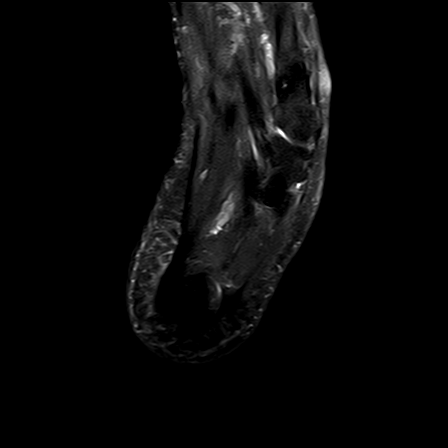
[im 10/28]
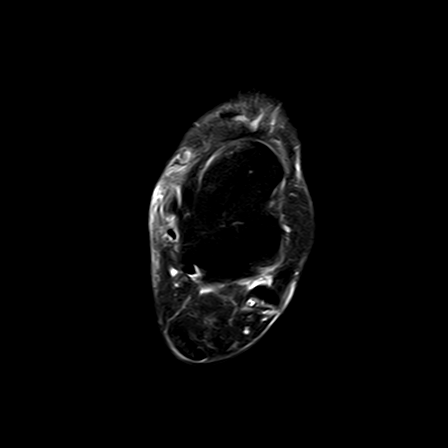
[im 19/28]
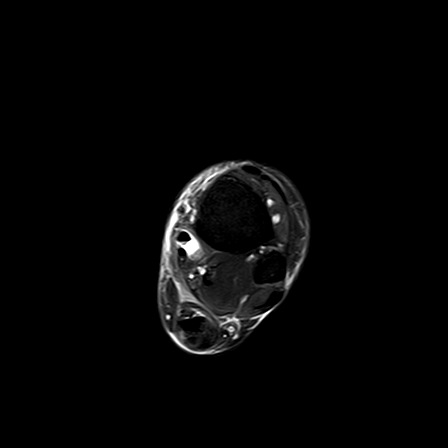
[im 28/28]
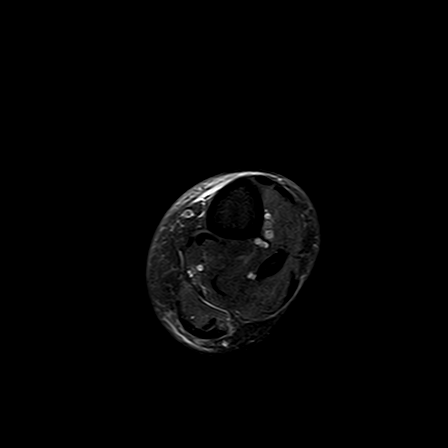

[Series 14: T1 fat-sat · axial · 5.0mm · 0.53mm/px · z∈[-80,+67]mm · 4 of 28 slices shown]
[im 1/28]
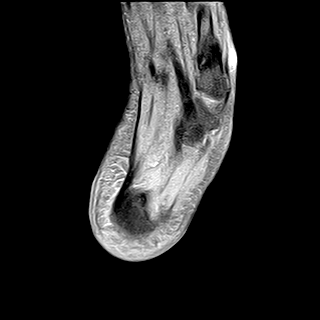
[im 10/28]
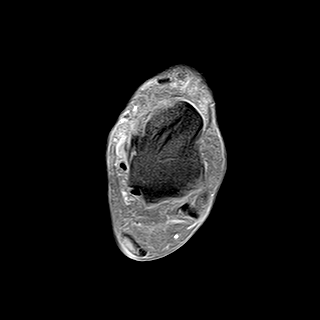
[im 19/28]
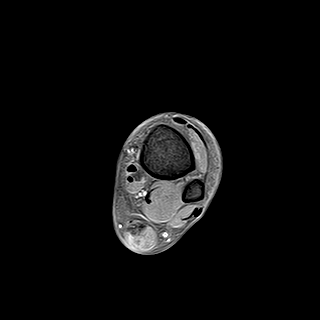
[im 28/28]
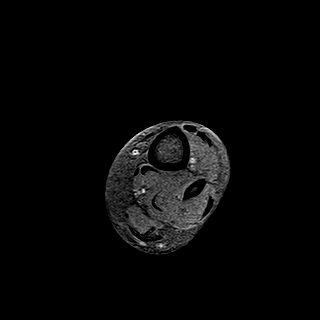

[Series 15: T1 fat-sat post-contrast · sagittal · 3.0mm · 0.33mm/px · 3 of 20 slices shown (1 of 2)]
[im 1/20]
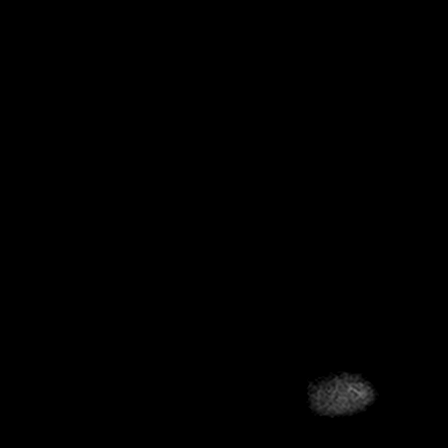
[im 10/20]
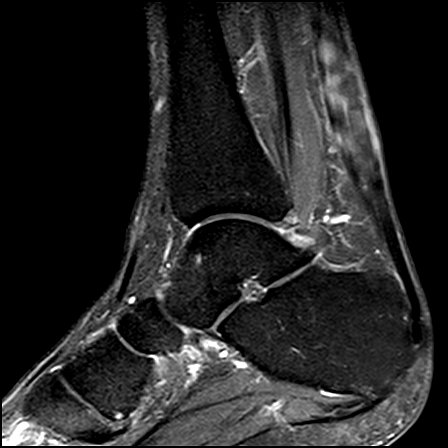
[im 20/20]
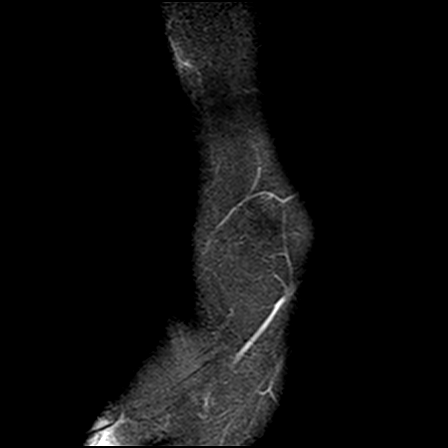

[Series 16: T1 fat-sat post-contrast · axial · 5.0mm · 0.53mm/px · z∈[-80,+67]mm · 4 of 28 slices shown (2 of 2)]
[im 1/28]
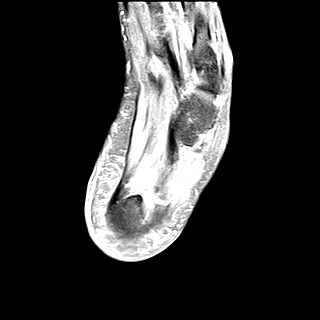
[im 10/28]
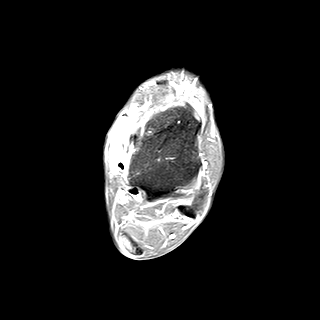
[im 19/28]
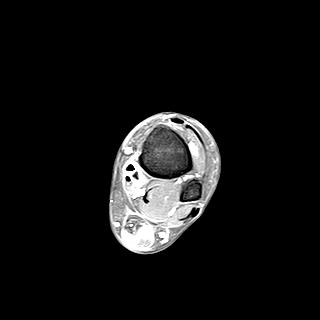
[im 28/28]
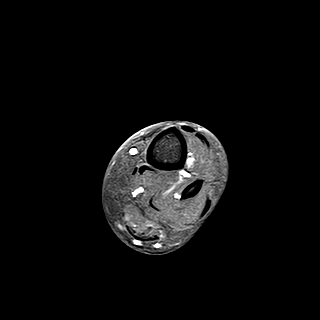

[33 of 40 positions shown; findings below may reference images not displayed]

FINDINGS: No acute bony lesions are seen at the left ankle.  There is no widening of ankle mortise.  Articular surface of the dome of the talus is smooth.  Medial and lateral collateral ligaments of the ankle are intact.  A 5 cm segment of the Achilles tendon is showing abnormal thickening and irregular heterogeneous T2 signal increase.  No abnormal enhancement is noted on the postcontrast study.  The lesion starts approximately 4 cm above the junction of the Achilles tendon with calcaneus.

Remaining tendons of the ankle are intact.  Subcutaneous edema of the anterior medial aspect of lower leg is noted.
IMPRESSION: 1. No acute bone changes at the left ankle.

2. Abnormal appearance of a 5 cm length of Achilles tendon, 4 cm above the junction of the Achilles tendon with the calcaneus as described above with thickening, heterogeneous texture and ill-defined margins.  Findings may represent posttraumatic changes with partial thickness tears. Differential diagnosis would include less likely possibility of neoplasm or infectious process.  Additional evaluation with ultrasound may be of use.  Surgical consultation is recommended.

## 2022-01-27 ENCOUNTER — Ambulatory Visit (HOSPITAL_COMMUNITY)
Admission: RE | Admit: 2022-01-27 | Discharge: 2022-01-27 | Disposition: A | Discharge status: Still In Hospital | Payer: 59 | Source: Ambulatory Visit

## 2022-01-27 ENCOUNTER — Other Ambulatory Visit (HOSPITAL_COMMUNITY): Payer: Self-pay | Admitting: Orthopaedic Surgery

## 2022-01-27 ENCOUNTER — Other Ambulatory Visit: Payer: Self-pay

## 2022-01-27 DIAGNOSIS — M7662 Achilles tendinitis, left leg: Secondary | ICD-10-CM

## 2022-01-27 NOTE — PT Evaluation (Signed)
[facility]  Outpatient Physical Therapy  [place], [address]  [phone]  [phone]      Physical Therapy Lower Extremity Evaluation    Date: 01/27/2022  Patient's Name: Cynthia Villanueva  Date of Birth: 10/18/1960    PT diagnosis/Reason for Referral: Left achilles tendonitis, tendinitis of left posterior tibial tendon               SUBJECTIVE  Date of onset: Approximately June 2023    Mechanism of injury: Patient is unsure about mechanism, however was walking dog on leash when her dog forcefully pulled her 180 degrees. At the time, she did not feel a pull or pop in the achilles area but more so in the back. However this is all she can recall that could have caused pain. She does walk 3 miles a day usually and that can involve quite a bit of inclines.     Previous episodes/treatments: No prior PT sessions, did receive injection into the posterior tibial tendon which did help.     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     Next MD visit: Dr. Lilia Pro 02/13/22    Pain location: Right achilles region                     Pain description:  Burning and shooting pain initially up to the calf and knee. Achiness all the time. After the shot, she has gradually gotten better.     Pain frequency:  INTERMITTENT    Pain rating: Now 0   Best 0   Worst 1    Radiculopathy: No     Pain increases with:  Going up incline and uneven surfaces            decreases with : REST    Sensation: Intact     Weakness: Generalized weakness noted in right ankle with uneven surfaces     Sleep affected: No     Subjective Functional Reports:    Sitting: WFL    Standing: WFL    Walking: LIMITED on uneven surfaces as well as going up inclines     Lifting: WFL      Patient-Specific Functional Score:    Problem Score   1.  Walking uneven surfaces with inclines  9           Total 9           OBJECTIVE    AROM   right left   Ankle DF 15 15   Ankle PF 40 35   Ankle Inversion 40 40   Ankle Eversion 20 10     ROM comments Pain noted with ankle inversion PROM and AROM resistance     Strength     right left   Ankle DF  5 5   Ankle PF  5 4+   Ankle Inversion 5 4   Ankle Eversion 5 4+     Strength comments: Pain with resisted ankle inversion     SLS: Poor stability of bilateral SLS, however demonstrates increased postural sway on left compared to right for about 3-5 seconds.     Gait: NO ASSISTIVE DEVICE, RECIPROCATING STEPS WITH SYMMETRIC STRIDE LENGTH, NARROW BOS, and INITIATION OF GAIT WITHOUT HESITATION  Palpation: Noted mild tenderness along the posterior tibial tendon    Treatment provided:REVIEW OF POC AND GOALS WITH PATIENT, ALL QUESTIONS ANSWERED, PATIENT EDUCATION, and THERAPEUTIC EXERCISE     Access Code: [email or web address]  URL: [email or web address]  Date: 01/27/2022  Prepared by: William Hamburger    Exercises  - Long Sitting Isometric Ankle Inversion in Dorsiflexion with Ball at [place]  - 2 x daily - 7 x weekly - 2-3 sets - 10 reps - 2-3 seconds hold  - Long Sitting Isometric Ankle Eversion in Dorsiflexion with Ball at [employer]  - 2 x daily - 7 x weekly - 2-3 sets - 10 reps - 2-3 seconds hold  - Seated Heel Toe Raises  - 2 x daily - 7 x weekly - 2-3 sets - 10 reps - 2-3 seconds hold  - Seated Ankle Inversion Eversion PROM  - 2 x daily - 7 x weekly - 2-3 sets - 10 reps - 5 seconds hold          ASSESSMENT    Impression: Uilani is a 61 year old female who presents with left posterior tibial tendon tendonitis and achilles pain. She does report since she has had the injection into the left ankle, she is feeling mostly back to baseline except for when she is ambulating in her yard on uneven surfaces and more so with inclines in yard. During assessment today, she does present with mild weakness of the ankle stability groups on left compared to  right as well as mild limitations of ROM into left ankle inversion with increased discomfort. She also presents with poor stability with single leg stance activities, however no increase in pain with SLS. Patient will tolerate continued PT services to progress with functional stability with dynamic activities to maximize return to full function.     Rehab potential: GOOD    Short Term Goals: 3 Weeks   -Increase left ankle AROM and PROM to full range compared to right without increase in pain.    -Patient will improve SLS on left from 3 seconds to 10 seconds or greater on stable surfaces to improve stability of left ankle.    -Patient will tolerate x5 minutes or greater on treadmill incline 2.0-3.0 without increase in pain.    -Patient will be independent in HEP.    Long Term Goals: 6 Weeks   -Patient will be able to tolerate standing on bosu ball for 30 seconds with bilateral UE support with no increase in pain and SBA.    -Patient will tolerate ambulating on uneven surfaces with inclines with no increase in left ankle pain.    -Patient will be independent with left ankle stability exercises to continue progressing with ease of ambulating on uneven surfaces/inclines.         PLAN  Patient will attend 1 times per week x 6 weeks. Therapy may include, but is not limited to THERAPEUTIC EXERCISES, MYOFASCIAL/JOINT MOBILIZATION, POSTURE/BODY MECHANICS, ERGONOMIC TRAINING, TRANSFER/GAIT TRAINING, HOME INSTRUCTIONS, HEAT/COLD, ULTRASOUND, ELECTRICAL STIMULATION, KINESIOTAPE, and NEURO RE-EDUCATIOIN    Plan for next visit Assess tolerance to initial HEP, progress with band exercises and further assess standing balance/stability of the left ankle      Evaluation complexity:   Personal factors impacting POC:  None known    Co-morbidities impacting POC:  None known   Complexity of physical exam: INCLUDING MUSCULOSKELETAL SYSTEM (POSTURE, ROM, STRENGTH, HEIGHT/WEIGHT) and INCLUDING NEUROMUSCULAR EXAM (BALANCE, GAIT, LOCOMOTION,  MOBILITY)   Clinical Presentation: STABLE   Evaluation Complexity: LOW-HISTORY 0,  EXAMINATION 1-2, STABLE PRESENTATION      Total Session Time 35, Timed code minutes 10, and Untimed code minutes 25         Intervention minutes: EVALUATION 25 minutes and THERAPEUTIC EXERCISE 10 minutes    William Hamburger, PT  01/27/2022, 08:29      Start of Service: _________          Certification:    From:______  Through:_________    I certify the need for these services furnished under this plan of treatment and while under my care.    Referring Provider Signature: _______________     Date : _____________________

## 2022-02-04 ENCOUNTER — Ambulatory Visit (HOSPITAL_COMMUNITY)
Admission: RE | Admit: 2022-02-04 | Discharge: 2022-02-04 | Disposition: A | Discharge status: Still In Hospital | Payer: 59 | Source: Ambulatory Visit

## 2022-02-04 ENCOUNTER — Other Ambulatory Visit: Payer: Self-pay

## 2022-02-04 NOTE — PT Treatment (Signed)
[facility]  Outpatient Physical Therapy  [address]  [place], [address]  [phone]  (Fax) [phone]    Physical Therapy Treatment Note    Date: 02/04/2022  Patient's Name: Tyffani Foglesong  Date of Birth: 1960/05/09            Visit #/POC:2 of 8-10  Authorization:20 per cy  POC Signed?: yes  POC Ends: 03/10/22  Next Progress Note Due: 02/27/22      Evaluating Physical Therapist: Louretta Parma, DPT  PT diagnosis/Reason for Referral: L Achilles Tendonitis, L PTT Tendonitis  Next Scheduled Physician Appointment: TBA   Allergies/Contraindications: Penicillins          Subjective: Patient states she is doing well with no pain complaints upon arrival. "I had to wait so long to get in here to start therapy, I'm really not having any pain now." Patient did state that she does notice she has difficulty when she has to walk on the side of a bank.    Objective: Observed ankle and performed palpations to L foot to assess areas of tenderness or lack of motion. She does have a high arch, and callus noted at medial aspect of calcaneus. Patient normally wears a stability shoe, but has recently purchased a light weight, neutral shoe. Educated patient on proper footwear based on her foot mechanics and wear pattern. Did perform balance activities, and patient is most challenged with SLS activities.    Measured ROM: not assessed today, 02/04/22  EXERCISE/ACTIVITY NAME REPETITIONS RESISTANCE COMPLETED THIS DOS   Rockerboard: F/B, S/S, level     15 each; 30 sec  y   Tandem stance, foam    Tandem gait, foam 4 x 15 sec    3 trips  y   SLS 3 x ea  y   SLS 3 object pick up   3 x ea  y                                   Access Code: [facility]  URL: [email or web address]  Date: 02/04/2022  Prepared by: Laurence Aly    Exercises  - Single Leg Stance  - 2 x daily - 7 x weekly - 1 sets - 5 reps    Assessment: Tol session well, CGA and SBA needed during balance activities. Issued handout of SLS  exercise for HEP.    Short Term Goals: 3 Weeks               -Increase left ankle AROM and PROM to full range compared to right without increase in pain.                -Patient will improve SLS on left from 3 seconds to 10 seconds or greater on stable surfaces to improve stability of left ankle.                -Patient will tolerate x5 minutes or greater on treadmill incline 2.0-3.0 without increase in pain.                -Patient will be independent in HEP.     Long Term Goals: 6 Weeks               -Patient will be able to tolerate standing on bosu ball for 30 seconds with bilateral UE support with no increase in pain and SBA.                -  Patient will tolerate ambulating on uneven surfaces with inclines with no increase in left ankle pain.                -Patient will be independent with left ankle stability exercises to continue progressing with ease of ambulating on uneven surfaces/inclines.     Plan: Cont with balance activities.    Total Session Time 40, Timed code minutes 40, and Untimed code minutes 0  THERAPEUTIC EXERCISE 40 minutes      Elinor Parkinson, PTA  02/04/2022, 11:04

## 2022-02-11 ENCOUNTER — Other Ambulatory Visit: Payer: Self-pay

## 2022-02-11 ENCOUNTER — Ambulatory Visit
Admission: RE | Admit: 2022-02-11 | Discharge: 2022-02-11 | Disposition: A | Discharge status: Still In Hospital | Payer: 59 | Source: Ambulatory Visit | Attending: Orthopaedic Surgery | Admitting: Orthopaedic Surgery

## 2022-02-11 NOTE — PT Treatment (Addendum)
[facility]  Outpatient Physical Therapy  [address]  [place], [address]  [phone]  (Fax) [phone]    Physical Therapy Treatment Note    Date: 02/11/2022  Patient's Name: Cynthia Villanueva  Date of Birth: 25-Apr-1960            Visit #/POC:3 of 8-10  Authorization:20 per cy  POC Signed?: yes  POC Ends: 03/10/22  Next Progress Note Due: 02/27/22        Evaluating Physical Therapist: Louretta Parma, DPT  PT diagnosis/Reason for Referral: L Achilles Tendonitis, L PTT Tendonitis  Next Scheduled Physician Appointment: TBA        Allergies/Contraindications: Penicillins              Subjective: Patient states she is doing okay, but does have tenderness above medial malleolus and along PTT. Patient states she has been doing a lot of work outside her yard, and contributes the soreness from that. Mild swelling noted.     Objective: Warm up on Nustep followed by exercises per flowsheet for proprioception and strengthening. Does have difficulty with SLS and tandem positions but is able to correct. Trial of inhibitory taping of posterior tibialis.      Measured ROM: not assessed today, 02/04/22  EXERCISE/ACTIVITY NAME REPETITIONS RESISTANCE COMPLETED THIS DOS   Rockerboard: F/B, S/S, level       15 each; 30 sec   N    Tandem stance, foam     Tandem gait, foam 4 x 15 sec     3 trips   y   SLS 3 x ea   y   SLS 3 object pick up    3 x ea   N    Trampoline tosses: Modified tandem and SLS   10 x ea   Y     MFR to Posterior Tibialis   manual Y      Nustep  5 5 Y                              Assessment: Patient has a RTD appointment on Friday. Patient is considering DC after her appointment, depending on how well her visit goes. 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     Short Term Goals: 3 Weeks               -Increase left ankle AROM and PROM to full range compared to right without increase in pain.                 -Patient will improve SLS on left from 3 seconds to 10 seconds or greater on stable surfaces to improve stability of left ankle.                -Patient will tolerate x5 minutes or greater on treadmill incline 2.0-3.0 without increase in pain.                -Patient will be independent in HEP.     Long Term Goals: 6 Weeks               -Patient will be able to tolerate standing on bosu ball for 30 seconds with bilateral UE support with no increase in pain and SBA.                -  Patient will tolerate ambulating on uneven surfaces with inclines with no increase in left ankle pain.                -Patient will be independent with left ankle stability exercises to continue progressing with ease of ambulating on uneven surfaces/inclines.      Plan: Await from MD on DC orders if necessary.       Total Session Time 40, Timed code minutes 40, and Untimed code minutes 0  THERAPEUTIC EXERCISE 40 minutes      Laurence Aly, PTA  02/11/2022, 10:24      PATIENT ATTENDED 3 PT VISITS BETWEEN 01/27/22 AND 02/11/22 WITH DX OF LBP. THERAPY INCLUDED PATIENT EDUCATION, EXERCISES FOR MOBILITY/STRENGTHENING/ENDURANCE AND MFR. AS OF 01/07/22. WE HAVE NOT HEARD BACK REGARDING CONTINUED PHYSICAL THERAPY; THEREFORE, PATIENT IS D/C AT THIS TIME.   Charlestine Massed, PT  03/19/2022 15:06

## 2022-02-18 ENCOUNTER — Ambulatory Visit (HOSPITAL_COMMUNITY): Payer: Self-pay

## 2022-05-12 ENCOUNTER — Other Ambulatory Visit: Payer: Self-pay

## 2022-05-12 ENCOUNTER — Ambulatory Visit (INDEPENDENT_AMBULATORY_CARE_PROVIDER_SITE_OTHER): Payer: 59 | Admitting: Surgery

## 2022-05-12 ENCOUNTER — Encounter (INDEPENDENT_AMBULATORY_CARE_PROVIDER_SITE_OTHER): Payer: Self-pay | Admitting: Surgery

## 2022-05-12 VITALS — BP 136/63 | HR 76 | Temp 98.2°F | Ht 69.0 in | Wt 216.0 lb

## 2022-05-12 DIAGNOSIS — K449 Diaphragmatic hernia without obstruction or gangrene: Secondary | ICD-10-CM

## 2022-05-12 DIAGNOSIS — K219 Gastro-esophageal reflux disease without esophagitis: Secondary | ICD-10-CM

## 2022-05-12 DIAGNOSIS — Z86010 Personal history of colonic polyps: Secondary | ICD-10-CM

## 2022-05-12 MED ORDER — SUTAB 1.479-0.188-0.225 GRAM TABLET
ORAL_TABLET | ORAL | 0 refills | Status: DC
Start: 2022-05-12 — End: 2022-07-22

## 2022-05-15 ENCOUNTER — Encounter (INDEPENDENT_AMBULATORY_CARE_PROVIDER_SITE_OTHER): Payer: Self-pay | Admitting: Surgery

## 2022-05-15 NOTE — Progress Notes (Signed)
GENERAL SURGERY, [facility]  [address]  [place] [address]    History and Physical     Name: Cynthia Villanueva MRN:  [id number]   Date: 05/12/2022 Age: 62 y.o.            Reason for Visit: Colonoscopy and EGD    History of Present Illness  [name] presents today EGD and colonoscopy because of hiatal hernia with GERD and personal history of polyps.    Negative diabetes, blood thinner, family history colon cancer    Review of the result(s) of each unique test:  Patient underwent diagnostic testing ( none ) prior to this dates visit.  I have personally reviewed the results and that serves as a component of the medical decision making for this encounter       Review of prior external note(s) from each unique source:  Patients referral to this office including a recent assessment by the referring provider.  This was reviewed by me for this unique office visit for the indication and intent of the referral as well as any pertinent medical or surgical history relevant to the patients independent evaluation by me today.      Patient History  Past Medical History:   Diagnosis Date    GERD (gastroesophageal reflux disease)     Hiatal hernia     Mixed hyperlipidemia     Sleep apnea     Vitamin D deficiency          Past Surgical History:   Procedure Laterality Date    CESAREAN SECTION      HX ROTATOR CUFF REPAIR Right     HX TUBAL LIGATION           Current Outpatient Medications   Medication Sig    ascorbic acid (VITAMIN C) 1,000 mg Oral Tablet Take 1 Tablet (1,000 mg total) by mouth Once a day    calcium carbonate (CALCIUM 600 ORAL) Take by mouth    cholecalciferol, vitamin D3, 50 mcg (2,000 unit) Oral Tablet Take 1 Tablet (2,000 Units total) by mouth Once a day    famotidine (PEPCID) 40 mg Oral Tablet Take 1 Tablet (40 mg total) by mouth Once a day    multivitamin-minerals-lutein (MULTIVITAMIN 50 PLUS) Oral Tablet Take 1 Tablet by mouth Once a day    pravastatin (PRAVACHOL) 20 mg Oral  Tablet Take 1 Tablet (20 mg total) by mouth Once a day    sod sulf-pot chloride-mag sulf (SUTAB) 1.479-0.188- 0.225 gram Oral Tablet Take 12 tablets with specified amount of water the evening prior to colonoscopy, as directed on the package. Take an additional 12 tablets with specified amount of water the morning of the colonoscopy, as directed on the package. Complete all tablets and required water at least 2 hours prior to procedure.     Allergies   Allergen Reactions    Penicillins Rash     Family Medical History:       Problem Relation (Age of Onset)    Bone cancer Father    Congestive Heart Failure Maternal Grandmother    Diabetes Maternal Grandmother    Leukemia Mother    Lung Cancer Father            Social History     Tobacco Use    Smoking status: Never    Smokeless tobacco: Never   Vaping Use    Vaping Use: Never used   Substance Use Topics    Alcohol use:  Never    Drug use: Never            Physical Examination:  Vitals:    05/12/22 1553   BP: 136/63   Pulse: 76   Temp: 36.8 C (98.2 F)   SpO2: 95%   Weight: 98 kg (216 lb)   Height: 1.753 m (5' 9"$ )   BMI: 31.96        General: appropriate for age. in no acute distress.    Vital signs are present above and have been reviewed by me     HEENT: Atraumatic, Normocephalic. PERRLA. EOMI. Nose clear. Throat clear    Lungs: Nonlabored breathing with symmetric expansion. Clear to auscultation bilaterally    Heart:Regular wth respect to rate and rythmn.    Abdomen:Soft. Nontender. Nondistended and benign    Extremities: Grossly normal. No major deformities     Neuro:  Grossly normal motor and sensory function    Psychiatric: Alert and oriented to person, place, and time. affect appropriate      Assessment and Plan  EGD and colonoscopy scheduled for 07/22/2022 at 11 o'clock      Follow Up:  No follow-ups on file.      ICD-10-CM    1. Hiatal hernia with gastroesophageal reflux  K44.9     K21.9       2. Personal history of colonic polyps  Z86.010           Cynthia Villanueva  Brynli Ollis, MD ,MBA,FACS    I appreciate the opportunity to be involved in the care of your patients.  If you have any questions or concerns regarding this encounter, please do not hesitate to contact me at your convenience.      This note may have been partially generated using MModal Fluency Direct system, and there may be some incorrect words, spellings, and punctuation that were not noted in checking the note before saving, though effort was made to avoid such errors.

## 2022-06-17 ENCOUNTER — Other Ambulatory Visit (HOSPITAL_COMMUNITY): Payer: Self-pay | Admitting: FAMILY PRACTICE

## 2022-06-17 ENCOUNTER — Encounter: Payer: Self-pay | Admitting: FAMILY PRACTICE

## 2022-06-17 DIAGNOSIS — Z1231 Encounter for screening mammogram for malignant neoplasm of breast: Secondary | ICD-10-CM

## 2022-06-22 ENCOUNTER — Inpatient Hospital Stay
Admission: RE | Admit: 2022-06-22 | Discharge: 2022-06-22 | Disposition: A | Discharge status: Home / Self Care | Payer: 59 | Source: Ambulatory Visit | Attending: FAMILY PRACTICE | Admitting: FAMILY PRACTICE

## 2022-06-22 ENCOUNTER — Encounter (HOSPITAL_COMMUNITY): Payer: Self-pay

## 2022-06-22 ENCOUNTER — Other Ambulatory Visit: Payer: Self-pay

## 2022-06-22 DIAGNOSIS — Z1231 Encounter for screening mammogram for malignant neoplasm of breast: Secondary | ICD-10-CM

## 2022-07-22 ENCOUNTER — Ambulatory Visit (HOSPITAL_COMMUNITY): Payer: 59 | Admitting: Certified Registered"

## 2022-07-22 ENCOUNTER — Encounter (HOSPITAL_COMMUNITY): Payer: 59 | Admitting: Surgery

## 2022-07-22 ENCOUNTER — Inpatient Hospital Stay
Admission: RE | Admit: 2022-07-22 | Discharge: 2022-07-22 | Disposition: A | Discharge status: Home / Self Care | Payer: 59 | Source: Ambulatory Visit | Attending: Surgery | Admitting: Surgery

## 2022-07-22 ENCOUNTER — Other Ambulatory Visit: Payer: Self-pay

## 2022-07-22 ENCOUNTER — Encounter (HOSPITAL_COMMUNITY): Payer: Self-pay | Admitting: Surgery

## 2022-07-22 ENCOUNTER — Encounter (HOSPITAL_COMMUNITY)
Admission: RE | Disposition: A | Discharge status: Home / Self Care | Payer: Self-pay | Source: Ambulatory Visit | Attending: Surgery

## 2022-07-22 DIAGNOSIS — Z9989 Dependence on other enabling machines and devices: Secondary | ICD-10-CM | POA: Insufficient documentation

## 2022-07-22 DIAGNOSIS — K2950 Unspecified chronic gastritis without bleeding: Secondary | ICD-10-CM | POA: Insufficient documentation

## 2022-07-22 DIAGNOSIS — K219 Gastro-esophageal reflux disease without esophagitis: Secondary | ICD-10-CM | POA: Insufficient documentation

## 2022-07-22 DIAGNOSIS — E039 Hypothyroidism, unspecified: Secondary | ICD-10-CM | POA: Insufficient documentation

## 2022-07-22 DIAGNOSIS — Z86010 Personal history of colonic polyps: Secondary | ICD-10-CM | POA: Insufficient documentation

## 2022-07-22 DIAGNOSIS — K449 Diaphragmatic hernia without obstruction or gangrene: Secondary | ICD-10-CM

## 2022-07-22 DIAGNOSIS — Z1211 Encounter for screening for malignant neoplasm of colon: Secondary | ICD-10-CM

## 2022-07-22 DIAGNOSIS — G4730 Sleep apnea, unspecified: Secondary | ICD-10-CM | POA: Insufficient documentation

## 2022-07-22 DIAGNOSIS — E782 Mixed hyperlipidemia: Secondary | ICD-10-CM | POA: Insufficient documentation

## 2022-07-22 SURGERY — GASTROSCOPY WITH BIOPSY
Anesthesia: General | Wound class: Clean Contaminated Wounds-The respiratory, GI, Genital, or urinary

## 2022-07-22 MED ORDER — PROPOFOL 10 MG/ML IV BOLUS
INJECTION | Freq: Once | INTRAVENOUS | Status: DC | PRN
Start: 2022-07-22 — End: 2022-07-22
  Administered 2022-07-22 (×2): 100 mg via INTRAVENOUS

## 2022-07-22 MED ORDER — DEXTROSE 5 % AND LACTATED RINGERS INTRAVENOUS SOLUTION
INTRAVENOUS | Status: DC | PRN
Start: 2022-07-22 — End: 2022-07-22
  Administered 2022-07-22: 0 via INTRAVENOUS

## 2022-07-22 MED ORDER — LIDOCAINE (PF) 100 MG/5 ML (2 %) INTRAVENOUS SYRINGE
INJECTION | Freq: Once | INTRAVENOUS | Status: DC | PRN
Start: 2022-07-22 — End: 2022-07-22
  Administered 2022-07-22: 100 mg via INTRAVENOUS

## 2022-07-22 SURGICAL SUPPLY — 4 items
CLEANER INSTRUMENT PRE-KLENZ 13.5 OZ (MISCELLANEOUS PT CARE ITEMS) ×1 IMPLANT
FORCEPS BIOPSY MICROMESH TTH STREAMLINE CATH NEEDLE 240CM 2.4MM RJ 4 SS LRG CPC STRL DISP ORNG 2.8MM (ENDOSCOPIC SUPPLIES) ×1 IMPLANT
USE ITEM 60432 FORCEPS BIOPSY MICROMESH TTH STREAMLINE CATH NEEDLE 240CM 2.4MM RJ 4 SS LRG CPC STRL DISP ORNG 2.8MM (ENDOSCOPIC SUPPLIES) ×1 IMPLANT
VALVE AIR/H20 DEFENDO BUTTON KIT SUCT BIOPSY STRL DISP (ENDOSCOPIC SUPPLIES) ×1 IMPLANT

## 2022-07-22 NOTE — Anesthesia Preprocedure Evaluation (Signed)
ANESTHESIA PRE-OP EVALUATION  Planned Procedure: EGD WITH BIOPSY  COLONOSCOPY  Review of Systems     anesthesia history negative     patient summary reviewed  nursing notes reviewed        Pulmonary   sleep apnea and CPAP,   Cardiovascular  negative cardio ROS,   ECG reviewed and hyperlipidemia , Exercise Tolerance: > or = 4 METS        GI/Hepatic/Renal    hiatal hernia and GERD        Endo/Other    obesity and drug induced coagulopathy,      Neuro/Psych/MS   negative neuro/psych ROS,      Cancer    negative hematology/oncology ROS,               Physical Assessment      Airway       Mallampati: II    TM distance: <3 FB    Mouth Opening: good.            Dental       Dentition intact             Pulmonary    Breath sounds clear to auscultation       Cardiovascular    Rhythm: regular  Rate: Normal       Other findings          Plan  ASA 2     Planned anesthesia type: general     general intravenous            SLEEP APNEA  Patient is at risk of obstructive sleep apnea and Education provided regarding risk of obstructive sleep apnea        Intravenous induction       Anesthetic plan and risks discussed with patient  signed consent obtained          Patient's NPO status is appropriate for Anesthesia.

## 2022-07-22 NOTE — Anesthesia Postprocedure Evaluation (Signed)
Anesthesia Post Op Evaluation    Patient: Cynthia Villanueva  Procedure(s):  EGD WITH BIOPSY  COLONOSCOPY    Last Vitals:Temperature: 36.6 C (97.9 F) (07/22/22 1039)  Heart Rate: 70 (07/22/22 1039)  BP (Non-Invasive): 129/68 (07/22/22 1039)  Respiratory Rate: 20 (07/22/22 1039)  SpO2: 97 % (07/22/22 1039)    No notable events documented.    Patient is sufficiently recovered from the effects of anesthesia to participate in the evaluation and has returned to their pre-procedure level.  Patient location during evaluation: PACU       Patient participation: complete - patient participated  Level of consciousness: awake and alert and responsive to verbal stimuli    Pain management: adequate  Airway patency: patent    Anesthetic complications: no  Cardiovascular status: acceptable  Respiratory status: acceptable  Hydration status: acceptable  Patient post-procedure temperature: Pt Normothermic   PONV Status: Absent

## 2022-07-22 NOTE — OR Surgeon (Signed)
[facility]      Patient Name: Cynthia Villanueva, Cynthia Villanueva Number: [id number]  Date of Service: 07/22/2022   Date of Birth: [birth year]      Pre-Operative Diagnosis: GERD  HIATAL HERNIA  PERSONAL HISTORY OF POLYPS     Post-Operative Diagnosis: Mild Gastritis   Grade B Esophagitis  Normal Colonoscopy    Procedure(s)/Description:  EGD WITH BIOPSY: 43239 (CPT)  COLONOSCOPY: 54098 (CPT)     Attending Surgeon: Fidela Juneau, MD     Anesthesia:  CRNA: Magdalene Patricia, CRNA    Anesthesia Type: .General     Specimens Removed:   ID Type Source Tests Collected by Time Destination   1 : Gatric Antrum Bx x1 Tissue Gastric SURGICAL PATHOLOGY SPECIMEN Jadier Rockers, Cherylann Parr, MD 07/22/2022 1115      Order Name Source Comment Collection Info Order Time   SURGICAL PATHOLOGY SPECIMEN Gastric Pre-op diagnosis:  GERDHIATAL HERNIA  PERSONAL HISTORY OF POLYPS Collected By: Fidela Juneau, MD 07/22/2022 11:22 AM     Release to patient   Automated            The patient indicates that they have read and understood the preoperative EGD with biopsy and colonoscopy consent form. The benefits, risks and alternatives to the procedure were discussed. I specifically discussed the risk of bleeding and/or perforation requiring operation.The patient indicates they have no further question and wish to proceed. Informed consent was obtained from the patient and/or medical power of attorney.  The patient was brought in to the endoscopy suite and placed on the stretcher in the left lateral decubitus position. The video gastroscope was then inserted into the mouth, down the esophagus and into the stomach after adequate IV and topical anesthetic was provided. The stomach was insufflated with air and examination of the stomach was performed. Antral biopsy was obtained and sent to the Pathology department for microscopic examination. Hemostasis was well obtained.  The scope was advanced to the pyloric channel.  The pylorus was cannulated and  the scope was advanced into the duodenum without any difficulty. Examination of the first and second portions of the duodenum was performed and the findings were noted as above. The scope was withdrawn back into the stomach in which an extensive examination was performed with the above noted findings. Retroflexion of the scope was performed which gave good visualization of the proximal stomach and GE junction from below. The scope was pulled back up into the proximal stomach and GE junction, with the above noted findings.  The scope was withdrawn back up into the esophagus and out the mouth without any difficulty. The patient tolerated the procedure well. No complications were encountered.   There were no unplanned events.  EKG, pulse, pulse oximetry and blood pressure were monitored throughout the entire procedure.  The patient was positioned on the stretcher in the left lateral decubitus position. After IV sedation was given, full finger digital rectal examination was performed with a circumferential sweep of the distal rectal mucosa. Subsequently, the flexible colonoscope was inserted into the rectum and passed without any difficulty. The colonoscope was then advanced up into the sigmoid colon, descending colon, transverse colon, right colon and cecum without any difficulty. Gross examination of each section of the colon was performed. Cecal intubation was achieved and the appendiceal orifice and ileocecal valve were identified. The operative findings were noted as described above. The colonoscope was withdrawn carefully examining the mucosa as the scope was being extracted with particular attention paid to  the proximal sides of folds, flexures, bends and rectal valves. At approximately 10 cm. from the anal verge, the colonoscope was retroflexed to fully examine the distal rectum. The colonoscope was removed and a repeat digital rectal examination was performed at the completion of the procedure. The patient  tolerated the procedure well. No intraoperative complications were encountered.  EKG, pulse, pulse oximetry and blood pressure were monitored throughout the entire procedure.  There were no unplanned events.    The patient will not need another screening colonoscopy for 10 years which is according to ASGE guidelines. However, if in the future the patient has any problems with abdominal pain, changes in bowel habits, blood in stool, etc., then they should contact me because they may be a candidate for diagnostic colonoscopy before the 10 year time limit.    The patient was instructed to contact me if they have any problems with their stomach and/or colon such as nausea, vomiting, bleeding, pain or changes in bowel habits. They understood and agreed to do so.  Aubryanna Nesheim B. Grecia Lynk, MD, MBA, FACS  [facility] -General Surgery

## 2022-07-22 NOTE — Discharge Instructions (Signed)
SURGICAL DISCHARGE INSTRUCTIONS     Dr. Donnal Debar, Gene B, MD  performed your EGD WITH BIOPSY, COLONOSCOPY today at the [facility]    [place]  [facility]:  Monday through Friday from 8 a.m. - 4 p.m.: [phone]    For T&D: [phone]  Between 4 p.m. - 8 a.m., weekends and holidays:  Call ER [phone]    PLEASE SEE WRITTEN HANDOUTS AS DISCUSSED BY YOUR NURSE:  findings today:  Mild Gastritis   Grade B Esophagitis  Normal Colonoscopy     SIGNS AND SYMPTOMS OF A WOUND / INCISION INFECTION   Be sure to watch for the following:  Increase in redness or red streaks near or around the wound or incision.  Increase in pain that is intense or severe and cannot be relieved by the pain medication that your doctor has given you.  Increase in swelling that cannot be relieved by elevation of a body part, or by applying ice, if permitted.  Increase in drainage, or if yellow / green in color and smells bad. This could be on a dressing or a cast.  Increase in fever for longer than 24 hours, or an increase that is higher than 101 degrees Fahrenheit (normal body temperature is 98 degrees Fahrenheit). The incision may feel warm to the touch.    **CALL YOUR DOCTOR IF ONE OR MORE OF THESE SIGNS / SYMPTOMS SHOULD OCCUR.    ANESTHESIA INFORMATION   ANESTHESIA -- ADULT PATIENTS:  You have received intravenous sedation / general anesthesia, and you may feel drowsy and light-headed for several hours. You may even experience some forgetfulness of the procedure. DO NOT DRIVE A MOTOR VEHICLE or perform any activity requiring complete alertness or coordination until you feel fully awake in about 24-48 hours. Do not drink alcoholic beverages for at least 24 hours. Do not stay alone, you must have a responsible adult available to be with you. You may also experience a dry mouth or nausea for 24 hours. This is a normal side effect and will disappear as the effects of the medication wear off.    REMEMBER   If you  experience any difficulty breathing, chest pain, bleeding that you feel is excessive, persistent nausea or vomiting or for any other concerns:  Call your physician Dr.  Donnal Debar, Cherylann Parr, MD   at [phone] . You may also ask to have the general doctor on call paged. They are available to you 24 hours a day.      SPECIAL INSTRUCTIONS / COMMENTS   Findings today:  Mild Gastritis   Grade B Esophagitis  Normal Colonoscopy   Rest today. No driving, working, cooking, Designer, television/film set today. Resume normal activity tomorrow.      FOLLOW-UP APPOINTMENTS   Please call your surgeon's office at the number listed to schedule a date / time of return for follow-up as needed.

## 2022-07-22 NOTE — H&P (Addendum)
Cynthia Villanueva  General Surgery  History and Physical    Date of Service:  07/22/2022  Cynthia Villanueva, 62 y.o. female  Date of Admission:  (Not on file)  Date of Birth:  Nov 26, 1960  PCP: Cynthia Ghazi, DO    Reason for admission:  EGD and colonoscopy    HPI:  Cynthia Villanueva is a 62 y.o. White female who is admitted for GERD  HIATAL HERNIA  PERSONAL HISTORY OF POLYPS     Cynthia Villanueva presents today EGD and colonoscopy because of hiatal hernia with GERD and personal history of polyps.     Negative diabetes, blood thinner, family history colon cancer     Review of the result(s) of each unique test:  Patient underwent diagnostic testing ( none ) prior to this dates visit.  I have personally reviewed the results and that serves as a component of the medical decision making for this encounter        Review of prior external note(s) from each unique source:  Patients referral to this office including a recent assessment by the referring provider.  This was reviewed by me for this unique office visit for the indication and intent of the referral as well as any pertinent medical or surgical history relevant to the patients independent evaluation by me today.       Past Medical History:   Diagnosis Date    GERD (gastroesophageal reflux disease)     Hiatal hernia     Mixed hyperlipidemia     Sleep apnea     Vitamin D deficiency       Past Surgical History:   Procedure Laterality Date    CESAREAN SECTION      HX ROTATOR CUFF REPAIR Right     HX TUBAL LIGATION        Social History     Tobacco Use    Smoking status: Never    Smokeless tobacco: Never   Vaping Use    Vaping status: Never Used   Substance Use Topics    Alcohol use: Never    Drug use: Never       Family Medical History:       Problem Relation (Age of Onset)    Bone cancer Father    Congestive Heart Failure Maternal Grandmother    Diabetes Maternal Grandmother    Leukemia Mother    Lung Cancer Father    No Known Problems Sister, Brother, Maternal  Grandfather, Paternal Grandmother, Paternal Grandfather, Daughter, Son, Maternal Aunt, Maternal Uncle, Paternal Aunt, Paternal Uncle, Other            Cannot display prior to admission medications because the patient has not been admitted in this contact.           Allergies   Allergen Reactions    Penicillins Rash          No data found.       General: appropriate for age. in no acute distress.    Vital signs are present above and have been reviewed by me     HEENT: Atraumatic, Normocephalic. PERRLA, EOMI. Nose clear. Throat clear.    Lungs: Nonlabored breathing with symmetric expansion.  Clear to auscultation bilaterally    Heart:Regular wth respect to rate and rythmn.    Abdomen:Soft. Nontender. Nondistended and benign    Extremities:  Grossly normal with good range of motion and no major deformities.    Neuro:  Grossly normal motor and sensory function. CN's  II through XII intact.    Psychiatric: Alert and oriented to person, place, and time. affect appropriate    Laboratory Data:     No results found for any visits on 07/22/22 (from the past 24 hour(s)).    Imaging Studies:    No orders to display        Assessment/Plan:  GERD  HIATAL HERNIA  PERSONAL HISTORY OF POLYPS    EGD and colonoscopy scheduled for Wednesday July 22, 2022     Discussed indications, risks and benefits of esophagogastroduodenoscopy and colonoscopy with the patient.  Discussed the possibility of polypectomy, biopsies, and possible repeat examinations.  Risks include bleeding, sedation risks, possibility of missed diagnosis of polyp or malignancy, and remote possibilities of perforation and death.  All questions were answered, and informed consent was clearly obtained.    This note was partially created using voice recognition software and is inherently subject to errors including those of syntax and "sound alike " substitutions which may escape proof reading. In such instances, original meaning may be extrapolated by contextual  derivation.    Fidela Juneau, MD, MBA, FACS

## 2022-07-23 ENCOUNTER — Telehealth (INDEPENDENT_AMBULATORY_CARE_PROVIDER_SITE_OTHER): Payer: Self-pay | Admitting: Surgery

## 2022-07-23 DIAGNOSIS — K2970 Gastritis, unspecified, without bleeding: Secondary | ICD-10-CM

## 2022-07-23 DIAGNOSIS — K209 Esophagitis, unspecified without bleeding: Secondary | ICD-10-CM

## 2022-07-23 LAB — SURGICAL PATHOLOGY SPECIMEN

## 2022-09-25 ENCOUNTER — Encounter (INDEPENDENT_AMBULATORY_CARE_PROVIDER_SITE_OTHER): Payer: Self-pay | Admitting: Internal Medicine

## 2022-10-20 ENCOUNTER — Ambulatory Visit (INDEPENDENT_AMBULATORY_CARE_PROVIDER_SITE_OTHER): Payer: 59 | Admitting: Internal Medicine

## 2022-10-20 ENCOUNTER — Encounter (INDEPENDENT_AMBULATORY_CARE_PROVIDER_SITE_OTHER): Payer: Self-pay | Admitting: Internal Medicine

## 2022-10-20 ENCOUNTER — Other Ambulatory Visit: Payer: Self-pay

## 2022-10-20 VITALS — BP 146/81 | HR 75 | Resp 18 | Ht 69.0 in | Wt 200.0 lb

## 2022-10-20 DIAGNOSIS — G4730 Sleep apnea, unspecified: Secondary | ICD-10-CM | POA: Insufficient documentation

## 2022-10-20 DIAGNOSIS — K449 Diaphragmatic hernia without obstruction or gangrene: Secondary | ICD-10-CM

## 2022-10-20 DIAGNOSIS — K219 Gastro-esophageal reflux disease without esophagitis: Secondary | ICD-10-CM

## 2022-10-20 DIAGNOSIS — E782 Mixed hyperlipidemia: Secondary | ICD-10-CM

## 2022-10-20 DIAGNOSIS — E559 Vitamin D deficiency, unspecified: Secondary | ICD-10-CM

## 2022-10-20 DIAGNOSIS — G4733 Obstructive sleep apnea (adult) (pediatric): Secondary | ICD-10-CM

## 2022-10-20 MED ORDER — PRAVASTATIN 20 MG TABLET
20.0000 mg | ORAL_TABLET | Freq: Every day | ORAL | 3 refills | Status: DC
Start: 2022-10-20 — End: 2023-04-27

## 2022-10-20 NOTE — Progress Notes (Signed)
INTERNAL MEDICINE, [facility]  [address]  [place] [address]       Name: Cynthia Villanueva MRN:  [id number]   Date: 10/20/2022 Age: 62 y.o.       Chief Complaint:    Chief Complaint   Patient presents with    New Patient     Is currently having PT on RT knee.        HPI:  Cynthia Villanueva is a 62 y.o. female who presents to the office today for follow-up.  Patient is actually doing relatively well and except for minor issues as described below which I have addressed in total with the patient.        Past Medical History:  Past Medical History:   Diagnosis Date    GERD (gastroesophageal reflux disease)     Hiatal hernia     Mixed hyperlipidemia     Sleep apnea     Vitamin D deficiency          Past Surgical History:   Procedure Laterality Date    CESAREAN SECTION      COLONOSCOPY      07/22/22    ENDOMETRIAL ABLATION      2007    HX CESAREAN SECTION      HX ROTATOR CUFF REPAIR Right     HX TUBAL LIGATION      UPPER GASTROINTESTINAL ENDOSCOPY      07/22/22      Current Outpatient Medications   Medication Sig    cholecalciferol, vitamin D3, 50 mcg (2,000 unit) Oral Tablet Take 1 Tablet (2,000 Units total) by mouth Once a day    famotidine (PEPCID) 40 mg Oral Tablet Take 1 Tablet (40 mg total) by mouth Once a day    pravastatin (PRAVACHOL) 20 mg Oral Tablet Take 1 Tablet (20 mg total) by mouth Once a day     Allergies   Allergen Reactions    Penicillins Rash       Family History:  Family Medical History:       Problem Relation (Age of Onset)    Bone cancer Father    Congestive Heart Failure Maternal Grandmother    Diabetes Maternal Grandmother    Leukemia Mother    Lung Cancer Father    No Known Problems Sister, Brother, Maternal Aunt, Maternal Uncle, Paternal Aunt, Paternal Uncle, Maternal Grandfather, Paternal Grandmother, Paternal Grandfather, Daughter, Son, Other              Social History:   Social History     Tobacco Use   Smoking Status Never    Passive exposure: Never   Smokeless Tobacco Never      Social History     Substance and Sexual Activity   Alcohol Use Never     Social History     Occupational History    Not on file       Review of Systems:  Review of systems as discussed in HPI    Problem List:  Patient Active Problem List   Diagnosis    Sleep apnea    Mixed hyperlipidemia    Hiatal hernia    GERD (gastroesophageal reflux disease)    Vitamin D deficiency       Physical Examination:  BP (!) 146/81 (Site: Right Arm, Patient Position: Sitting, Cuff Size: Adult)   Pulse 75   Resp 18   Ht 1.753 m (5\' 9" )   Wt 90.7 kg (200 lb)   SpO2 96%  BMI 29.53 kg/m       Physical Exam  Vitals and nursing note reviewed.   Constitutional:       General: She is not in acute distress.     Appearance: Normal appearance.   HENT:      Head: Normocephalic and atraumatic.      Right Ear: Tympanic membrane normal.      Left Ear: Tympanic membrane normal.      Nose: Nose normal.      Mouth/Throat:      Mouth: Mucous membranes are moist.      Pharynx: Oropharynx is clear.   Eyes:      General:         Right eye: No discharge.         Left eye: No discharge.      Conjunctiva/sclera: Conjunctivae normal.      Pupils: Pupils are equal, round, and reactive to light.   Cardiovascular:      Rate and Rhythm: Normal rate and regular rhythm.      Pulses:           Carotid pulses are 0 on the right side and 0 on the left side.       Popliteal pulses are 2+ on the right side and 2+ on the left side.        Dorsalis pedis pulses are 2+ on the right side and 2+ on the left side.      Heart sounds: Normal heart sounds.   Pulmonary:      Effort: Pulmonary effort is normal.      Breath sounds: Normal breath sounds. No wheezing, rhonchi or rales.   Abdominal:      General: Bowel sounds are normal. There is no distension.      Palpations: Abdomen is soft.      Tenderness: There is no abdominal tenderness. There is no rebound.   Musculoskeletal:         General: No tenderness or signs of injury. Normal range of motion.      Cervical  back: Normal range of motion. No tenderness.   Lymphadenopathy:      Cervical: No cervical adenopathy.   Skin:     General: Skin is warm.      Capillary Refill: Capillary refill takes less than 2 seconds.      Coloration: Skin is not jaundiced.   Neurological:      General: No focal deficit present.      Mental Status: She is alert and oriented to person, place, and time.      Cranial Nerves: No cranial nerve deficit.      Motor: No weakness.   Psychiatric:         Behavior: Behavior normal.         Health Maintenance:  Health Maintenance   Topic Date Due    NonMedicare Preventative Exam  Never done    Pap smear/HPV  Never done    Shingles Vaccine (1 of 2) 10/20/2023 (Originally 03/18/2009)    Influenza Vaccine (1) 12/06/2022    Depression Screening  10/20/2023    Breast Cancer Screening  06/21/2024    Adult Tdap-Td (1 - Tdap) 10/02/2024    Colonoscopy  07/21/2032    Covid-19 Vaccine  Completed    Meningococcal Vaccine  Aged Out    Pneumococcal Vaccine, Age 35-64  Aged Out    Hepatitis C screening  Discontinued    HIV Screening  Discontinued  Assessment:      ICD-10-CM    1. Obstructive sleep apnea syndrome  G47.33 The patient continues to use their CPAP every evening and is compliant with its use.  They continue to benefit from CPAP use and has had improve lifestyle with this.       2. Mixed hyperlipidemia  E78.2 Labs ordered here and on this visit will be be addressed by me. I will contact patient and address therapy options once completed.     pravastatin (PRAVACHOL) 20 mg Oral Tablet     LIPID PANEL      3. Hiatal hernia  K44.9 Labs ordered here and on this visit will be be addressed by me. I will contact patient and address therapy options once completed.     COMPREHENSIVE METABOLIC PANEL, NON-FASTING     VARICELLA-ZOSTER VIRUS (VZV) ANTIBODY, IGM, SERUM      4. Gastroesophageal reflux disease without esophagitis  K21.9 Condition stable will continue current therapy.        5. Vitamin D deficiency  E55.9  Labs ordered here and on this visit will be be addressed by me. I will contact patient and address therapy options once completed.          Orders Placed This Encounter    LIPID PANEL    COMPREHENSIVE METABOLIC PANEL, NON-FASTING    VARICELLA-ZOSTER VIRUS (VZV) ANTIBODY, IGM, SERUM    pravastatin (PRAVACHOL) 20 mg Oral Tablet       Depression screening is negative. PHQ 2 Total: 0         Follow up:  Return in about 6 months (around 04/22/2023).    This note was partially created using voice recognition software and is inherently subject to errors including those of syntax and "sound alike " substitutions which may escape proof reading.  In such instances, original meaning may be extrapolated by contextual derivation.    Lucia Gaskins, DO  10/20/2022, 11:00

## 2022-11-23 LAB — LIPID PANEL
CHOLESTEROL: 177
HDL-CHOLESTEROL: 53
LDL (CALCULATED): 99
TRIGLYCERIDES: 140
VLDL (CALCULATED): 25

## 2022-11-23 LAB — VARICELLA-ZOSTER VIRUS (VZV) ANTIBODY, IGM, SERUM: VARICELLA-ZOSTER VIRUS (VZV) ANTIBODY, IGM, SERUM: 0.91

## 2022-11-24 ENCOUNTER — Encounter (INDEPENDENT_AMBULATORY_CARE_PROVIDER_SITE_OTHER): Payer: Self-pay | Admitting: Internal Medicine

## 2022-11-24 ENCOUNTER — Encounter (INDEPENDENT_AMBULATORY_CARE_PROVIDER_SITE_OTHER): Payer: Self-pay

## 2022-11-24 ENCOUNTER — Other Ambulatory Visit (INDEPENDENT_AMBULATORY_CARE_PROVIDER_SITE_OTHER): Payer: Self-pay | Admitting: Internal Medicine

## 2022-11-24 DIAGNOSIS — K449 Diaphragmatic hernia without obstruction or gangrene: Secondary | ICD-10-CM

## 2022-11-24 DIAGNOSIS — E782 Mixed hyperlipidemia: Secondary | ICD-10-CM

## 2022-11-24 IMAGING — MR MRI KNEE RT W/O CONTRAST
5 series · 40 of 40 positions shown · non-contrast
Comparison: None available.

﻿EXAM:  [id number]   MRI KNEE RT W/O CONTRAST
INDICATION: Right knee pain.
TECHNIQUE: Noncontrast multiplanar, multisequence MRI was performed.

[Series 5: PD fat-sat · axial · right · 4.5mm · 0.53mm/px · z∈[-67,+78]mm · 8 of 30 slices shown (1 of 3)]
[im 1/30]
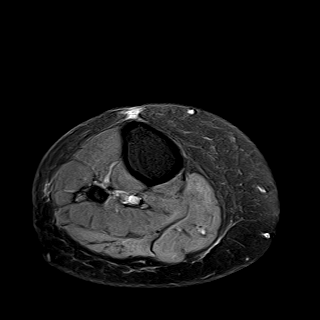
[im 5/30]
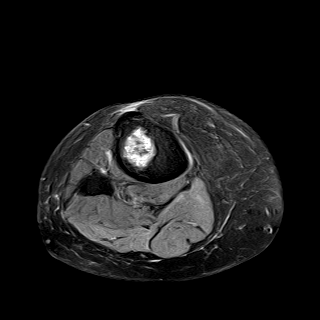
[im 9/30]
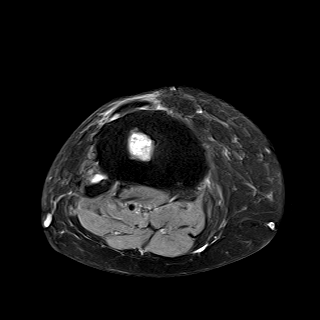
[im 13/30]
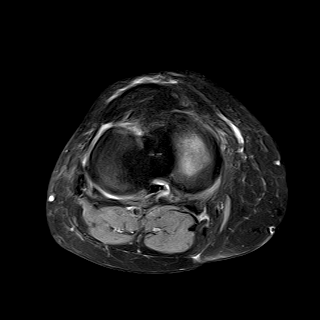
[im 17/30]
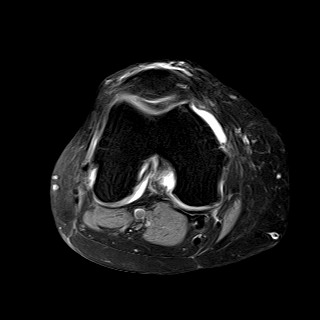
[im 21/30]
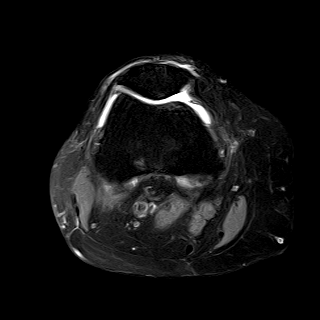
[im 25/30]
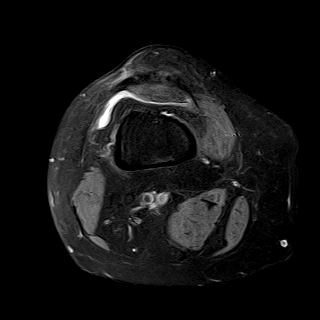
[im 30/30]
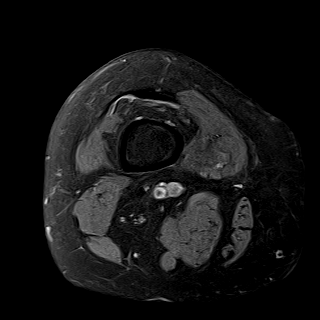

[Series 6: PD fat-sat · sagittal · right · 3.8mm · 0.47mm/px · 9 of 30 slices shown (2 of 3)]
[im 1/30]
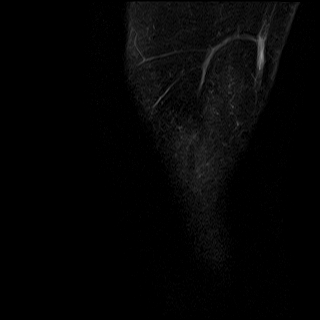
[im 4/30]
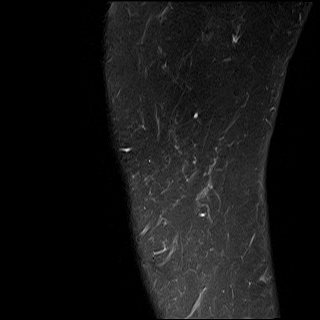
[im 8/30]
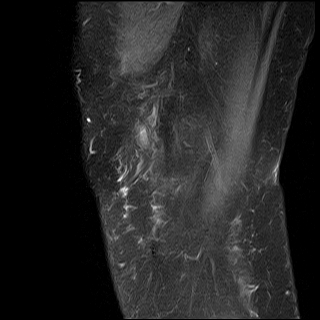
[im 11/30]
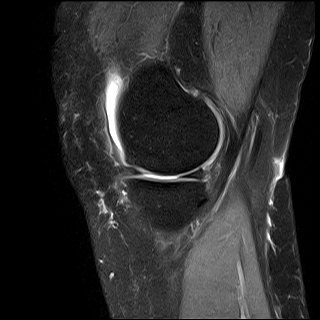
[im 15/30]
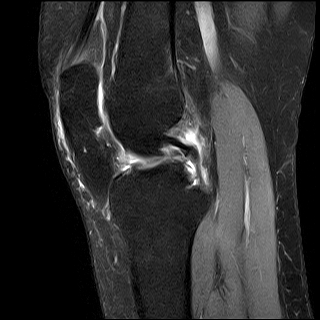
[im 19/30]
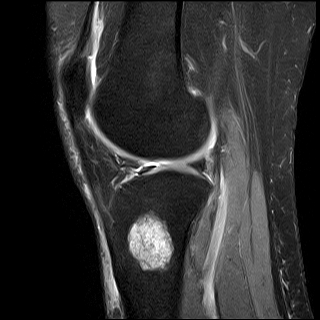
[im 22/30]
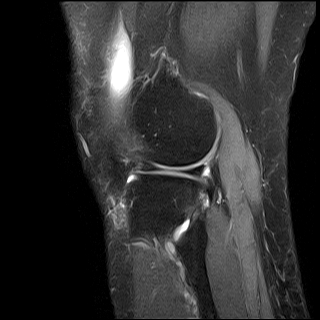
[im 26/30]
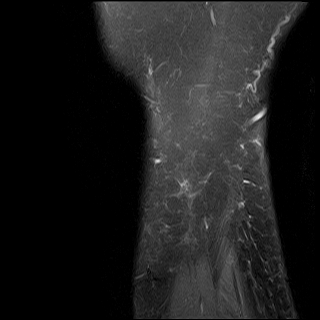
[im 30/30]
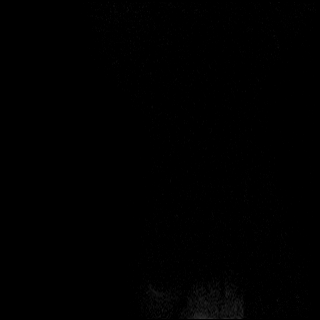

[Series 7: T1 · sagittal · right · 3.8mm · 0.39mm/px · 9 of 30 slices shown]
[im 1/30]
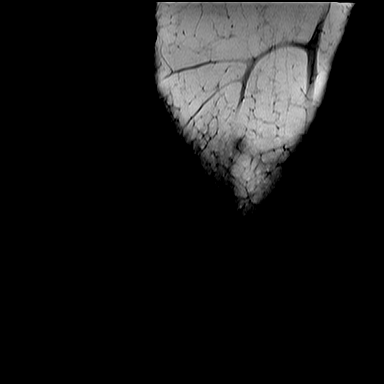
[im 4/30]
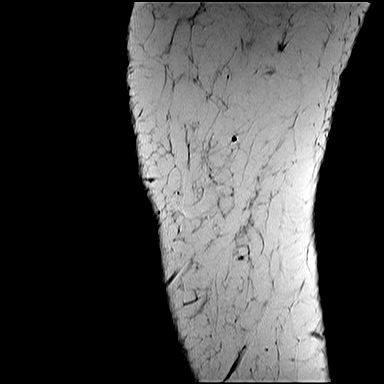
[im 8/30]
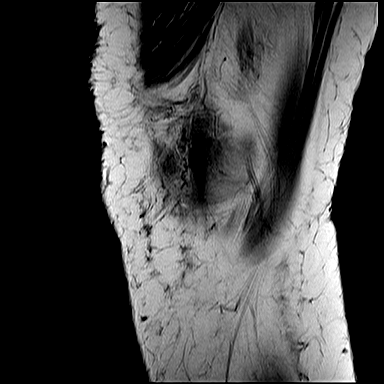
[im 11/30]
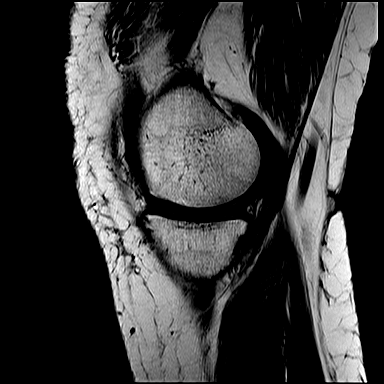
[im 15/30]
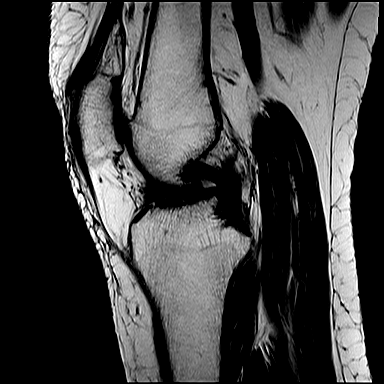
[im 19/30]
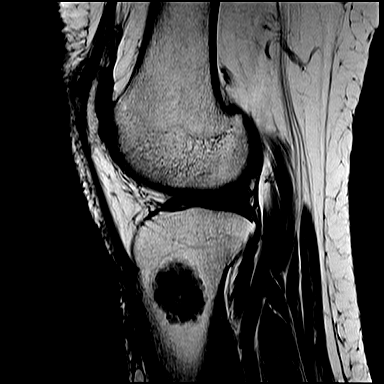
[im 22/30]
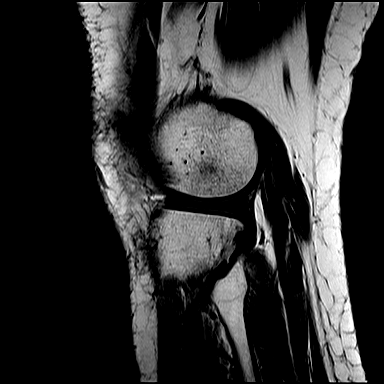
[im 26/30]
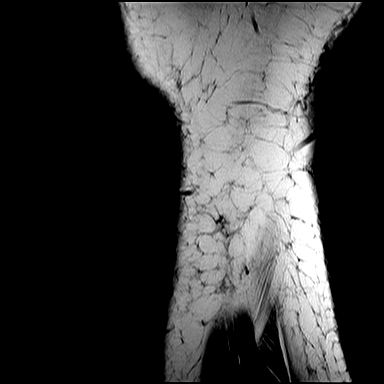
[im 30/30]
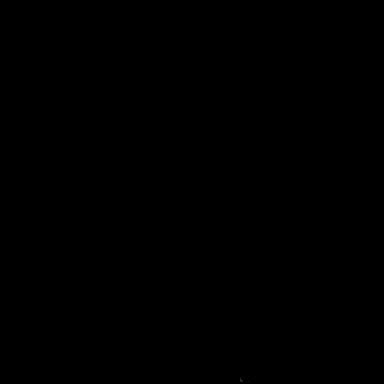

[Series 8: STIR · coronal · right · 4.5mm · 0.47mm/px · 7 of 24 slices shown]
[im 1/24]
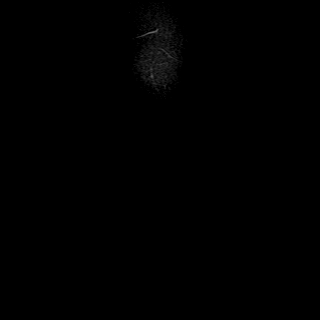
[im 4/24]
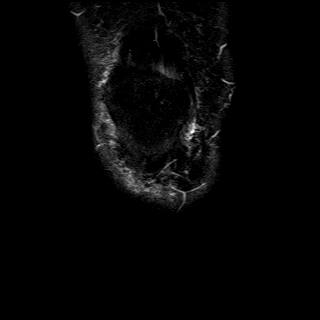
[im 8/24]
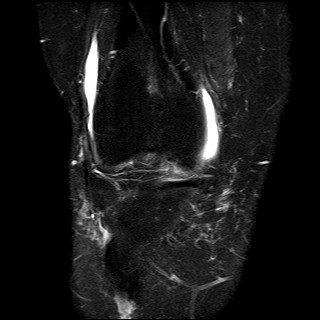
[im 12/24]
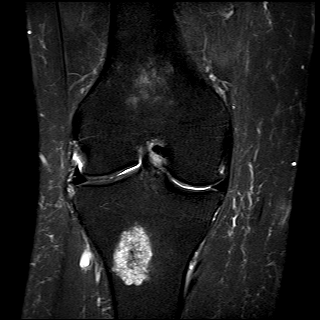
[im 16/24]
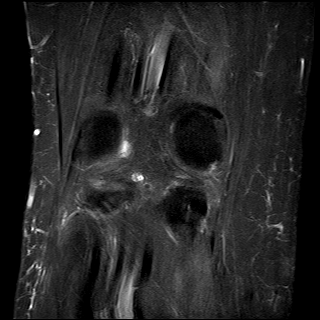
[im 20/24]
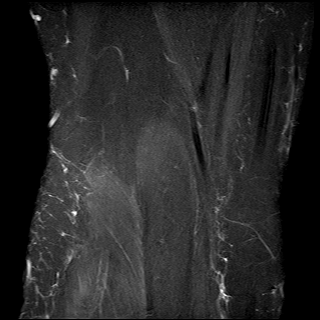
[im 24/24]
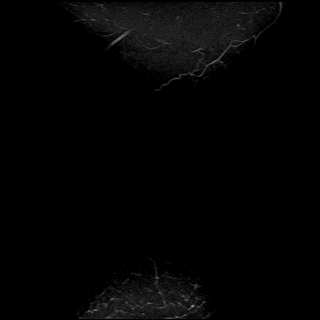

[Series 9: PD fat-sat · coronal · right · 4.5mm · 0.47mm/px · 7 of 24 slices shown (3 of 3)]
[im 1/24]
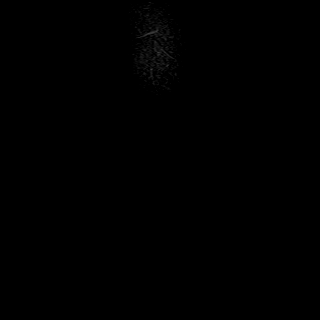
[im 4/24]
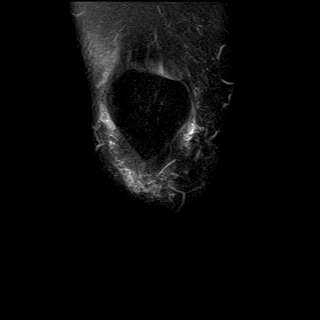
[im 8/24]
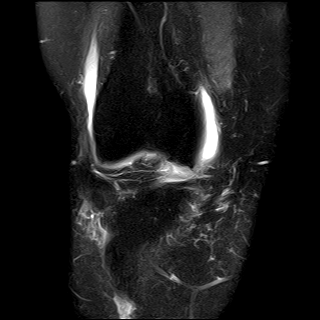
[im 12/24]
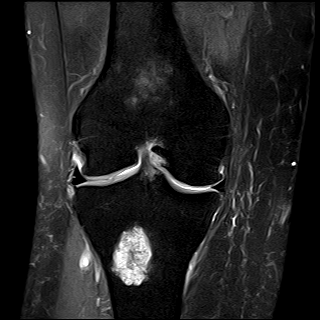
[im 16/24]
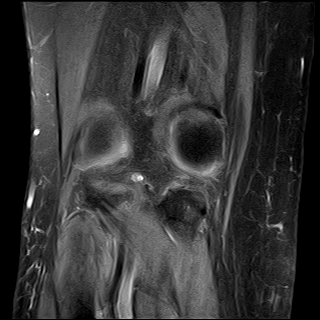
[im 20/24]
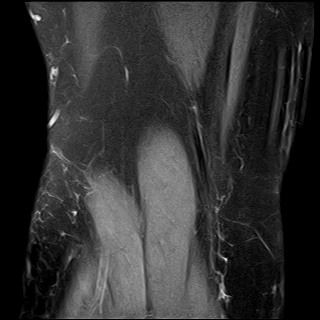
[im 24/24]
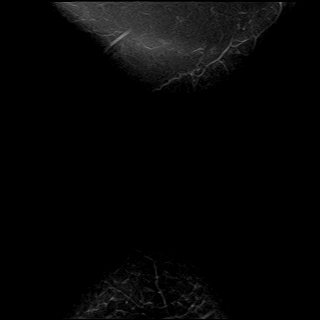

[40 of 40 positions shown; findings below may reference images not displayed]

FINDINGS: There is a circumscribed, nonaggressive 3 cm lesion in the proximal tibial metaphysis that probably represents an enchondroma.  

There are mild degenerative changes.  There is minimal joint fluid.

The menisci, cruciate ligaments, collateral ligaments, and extensor tendons appear intact.  There is no fracture, dislocation, or bone contusion.
IMPRESSION: 1. Benign lesion in the proximal tibia most likely an enchondroma.

2. Mild degenerative changes.

3. No internal derangement is seen.

## 2022-11-25 ENCOUNTER — Other Ambulatory Visit (HOSPITAL_COMMUNITY): Payer: 59 | Admitting: NURSE PRACTITIONER

## 2022-11-25 ENCOUNTER — Encounter (INDEPENDENT_AMBULATORY_CARE_PROVIDER_SITE_OTHER): Payer: Self-pay | Admitting: NURSE PRACTITIONER

## 2022-11-25 ENCOUNTER — Other Ambulatory Visit: Payer: Self-pay

## 2022-11-25 ENCOUNTER — Inpatient Hospital Stay
Admission: RE | Admit: 2022-11-25 | Discharge: 2022-11-25 | Disposition: A | Discharge status: Home / Self Care | Payer: 59 | Source: Ambulatory Visit | Attending: NURSE PRACTITIONER | Admitting: NURSE PRACTITIONER

## 2022-11-25 ENCOUNTER — Ambulatory Visit (INDEPENDENT_AMBULATORY_CARE_PROVIDER_SITE_OTHER): Payer: 59 | Admitting: NURSE PRACTITIONER

## 2022-11-25 VITALS — BP 139/73 | HR 83 | Temp 98.4°F | Resp 18 | Ht 69.02 in | Wt 200.0 lb

## 2022-11-25 DIAGNOSIS — R34 Anuria and oliguria: Secondary | ICD-10-CM

## 2022-11-25 DIAGNOSIS — N23 Unspecified renal colic: Secondary | ICD-10-CM

## 2022-11-25 DIAGNOSIS — R8290 Unspecified abnormal findings in urine: Secondary | ICD-10-CM

## 2022-11-25 LAB — BASIC METABOLIC PANEL
ANION GAP: 5 mmol/L (ref 4–13)
BUN/CREA RATIO: 11 (ref 6–22)
BUN: 8 mg/dL (ref 7–25)
CALCIUM: 9.2 mg/dL (ref 8.6–10.3)
CHLORIDE: 106 mmol/L (ref 98–107)
CO2 TOTAL: 27 mmol/L (ref 21–31)
CREATININE: 0.76 mg/dL (ref 0.60–1.30)
ESTIMATED GFR: 88 mL/min/{1.73_m2} (ref >59)
GLUCOSE: 107 mg/dL (ref 74–109)
OSMOLALITY, CALCULATED: 275 mOsm/kg (ref 270–290)
POTASSIUM: 3.8 mmol/L (ref 3.5–5.1)
SODIUM: 138 mmol/L (ref 136–145)

## 2022-11-25 MED ORDER — LEVOFLOXACIN 750 MG TABLET
750.0000 mg | ORAL_TABLET | Freq: Every day | ORAL | 0 refills | Status: AC
Start: 2022-11-25 — End: 2022-12-02

## 2022-11-25 MED ORDER — CIPROFLOXACIN 500 MG TABLET
500.0000 mg | ORAL_TABLET | Freq: Two times a day (BID) | ORAL | 0 refills | Status: DC
Start: 2022-11-25 — End: 2022-11-25

## 2022-11-25 NOTE — Progress Notes (Signed)
Pt notified of CT results and new abx sent to pharmacy. Pt advised to not take the Cipro. Pt voiced understanding.

## 2022-11-25 NOTE — Addendum Note (Signed)
Addended by: Timoteo Ace DAWN on: 11/25/2022 02:24 PM     Modules accepted: Orders

## 2022-11-25 NOTE — Progress Notes (Signed)
[facility]NAL [facility]ICINE, [facility]  [address]  [place] [address]      Acute Office Visit      NAME: Cynthia Villanueva DOS: 11/25/2022   MRN: [id number] DOB: [birth year]     Chief Complaint: Lower Urinary Tract Symptoms (Pt has c/o of severe lower back pain (right sided), decreased urinary output, and a low grade temperature. Denies any burning or hematuria. Symptoms started on Sunday.)      History of Present Illness: Cynthia Villanueva is a 62 y.o. female who comes in today with complaints of severe right sided pain over her kidney that is radiating around to her ribs and the front right side of her abd, running low grade fever, chills, strong foul odor to her urine and decreased urine output x 3 days.   She informs she has been taking some OTC tylenol which has helped minimally with the pain, but it seems to be getting worse.        No other acute concerns or complaints at this time.    Medical History     I have reviewed and updated as appropriate the past medical, surgical, family, and social history today:  Medical History/Surgical History/Family History/Social History    Allergies:  Allergies   Allergen Reactions    Penicillins Rash    Zostavax [Zoster Vaccine Live (Pf)]        Medications:  Current Outpatient Medications   Medication Sig    cholecalciferol, vitamin D3, 50 mcg (2,000 unit) Oral Tablet Take 1 Tablet (2,000 Units total) by mouth Once a day    ciprofloxacin HCl (CIPRO) 500 mg Oral Tablet Take 1 Tablet (500 mg total) by mouth Twice daily for 7 days    famotidine (PEPCID) 40 mg Oral Tablet Take 1 Tablet (40 mg total) by mouth Once a day    pravastatin (PRAVACHOL) 20 mg Oral Tablet Take 1 Tablet (20 mg total) by mouth Once a day       Problem List:  Patient Active Problem List    Diagnosis Date Noted    Sleep apnea 10/20/2022    Mixed hyperlipidemia 10/20/2022    Hiatal hernia 10/20/2022    GERD (gastroesophageal reflux disease) 10/20/2022    Vitamin D deficiency 10/20/2022        Objective   Vitals:  BP 139/73 (Site: Left Arm, Patient Position: Sitting, Cuff Size: Adult)   Pulse 83   Temp 36.9 C (98.4 F)   Resp 18   Ht 1.753 m (5' 9.02")   Wt 90.7 kg (200 lb)   SpO2 97%   BMI 29.52 kg/m       Physical Exam:  Physical Exam  Vitals and nursing note reviewed.   Constitutional:       General: She is not in acute distress.     Appearance: Normal appearance. She is ill-appearing. She is not diaphoretic.   HENT:      Head: Normocephalic and atraumatic.   Cardiovascular:      Rate and Rhythm: Normal rate.      Pulses: Normal pulses.      Heart sounds: Normal heart sounds, S1 normal and S2 normal.   Pulmonary:      Effort: Pulmonary effort is normal.      Breath sounds: Normal breath sounds.   Abdominal:      General: Abdomen is flat. Bowel sounds are normal.      Palpations: Abdomen is soft.      Tenderness: There is  abdominal tenderness in the suprapubic area. There is right CVA tenderness. There is no left CVA tenderness.   Skin:     General: Skin is warm and dry.      Capillary Refill: Capillary refill takes less than 2 seconds.   Neurological:      General: No focal deficit present.      Mental Status: She is alert and oriented to person, place, and time.            Assessment/Plan     Diagnosis/Plan:  Problem List Items Addressed This Visit    None  Visit Diagnoses       Renal pain    -  Primary    Relevant Medications    ciprofloxacin HCl (CIPRO) 500 mg Oral Tablet    Other Relevant Orders    BASIC METABOLIC PANEL    POCT Urine Dipstick    URINE CULTURE    CT RENAL CALCULI SERIES WO IV CONTRAST    Malodorous urine        Relevant Medications    ciprofloxacin HCl (CIPRO) 500 mg Oral Tablet    Other Relevant Orders    BASIC METABOLIC PANEL    POCT Urine Dipstick    URINE CULTURE    Anuria        Relevant Orders    BASIC METABOLIC PANEL    POCT Urine Dipstick    URINE CULTURE           Urine dipstick shows negative for all components. Due to severity of symptoms and foul odor to  urine, urine sent for culture and patient started on antibiotic therapy.   Order placed for stat renal CT and labs for bmp to be completed and further  clinical course will be based upon results.   Patient instructed that if symptoms worsen to go to ER for further evaluation.     Encounter Medications and Orders  Orders Placed This Encounter    URINE CULTURE    CT RENAL CALCULI SERIES WO IV CONTRAST    BASIC METABOLIC PANEL    POCT Urine Dipstick    ciprofloxacin HCl (CIPRO) 500 mg Oral Tablet       Return if symptoms worsen or fail to improve.      Macario Golds, FNP-C  11/25/2022 10:25     This note was partially created using M*Modal fluency direct system (voice recognition software ) and is inherently subject to errors including those of syntax and "sound- alike" substitutions which may escape proofreading.  In such instances, original meaning may be extrapolated by contextual derivation.Marland Kitchen

## 2022-11-27 LAB — URINE CULTURE: URINE CULTURE: 10000 — AB

## 2022-11-30 ENCOUNTER — Telehealth (INDEPENDENT_AMBULATORY_CARE_PROVIDER_SITE_OTHER): Payer: Self-pay | Admitting: Internal Medicine

## 2022-11-30 NOTE — Telephone Encounter (Signed)
Pt is finishing her last abx. Still having some breathing issues and pain in lower rt side.  What to do?

## 2022-11-30 NOTE — Telephone Encounter (Signed)
Patient notified to go to emergency room to be evaluated she voiced understanding ks,lpn

## 2022-12-09 ENCOUNTER — Other Ambulatory Visit: Payer: Self-pay

## 2022-12-09 ENCOUNTER — Ambulatory Visit (INDEPENDENT_AMBULATORY_CARE_PROVIDER_SITE_OTHER): Payer: 59 | Admitting: NURSE PRACTITIONER

## 2022-12-09 ENCOUNTER — Encounter (INDEPENDENT_AMBULATORY_CARE_PROVIDER_SITE_OTHER): Payer: Self-pay | Admitting: NURSE PRACTITIONER

## 2022-12-09 VITALS — BP 148/80 | HR 80 | Temp 97.9°F | Resp 18 | Ht 69.02 in | Wt 200.0 lb

## 2022-12-09 DIAGNOSIS — Z09 Encounter for follow-up examination after completed treatment for conditions other than malignant neoplasm: Secondary | ICD-10-CM

## 2022-12-09 DIAGNOSIS — I10 Essential (primary) hypertension: Secondary | ICD-10-CM

## 2022-12-09 DIAGNOSIS — Z8701 Personal history of pneumonia (recurrent): Secondary | ICD-10-CM

## 2022-12-09 NOTE — Progress Notes (Signed)
[facility], [facility]  [address]  [place] [address]       Name: Cynthia Villanueva MRN:  [id number]   Date: 12/09/2022 Age: 62 y.o.       Chief Complaint:    Chief Complaint   Patient presents with    Follow Up     F/u from PNA. Pt has completed Levaquin. States she feels better and denies any other symptoms at this time.        HPI:  Cynthia Villanueva is a 62 y.o. female who is here today to f/u on pna. She has completed full course of antibiotic therapy and states that she is feeling much better.     She is concerned about her blood pressure, and states that she has noticed it has been up more recently within the last couple months. She also endorses though that due to an injury to her ankle she has been less active and under more stress, as well as having increased pain.     She denies having any chest pain, pressure, shortness of breath, headaches or vision changes.     She denies any other problems or complaints.         Past Medical History:  Past Medical History:   Diagnosis Date    GERD (gastroesophageal reflux disease)     Hiatal hernia     Mixed hyperlipidemia     Sleep apnea     Vitamin D deficiency          Past Surgical History:   Procedure Laterality Date    CESAREAN SECTION      COLONOSCOPY      07/22/22    ENDOMETRIAL ABLATION      2007    HX CESAREAN SECTION      HX ROTATOR CUFF REPAIR Right     HX TUBAL LIGATION      UPPER GASTROINTESTINAL ENDOSCOPY      07/22/22      Current Outpatient Medications   Medication Sig    cholecalciferol, vitamin D3, 50 mcg (2,000 unit) Oral Tablet Take 1 Tablet (2,000 Units total) by mouth Once a day    famotidine (PEPCID) 40 mg Oral Tablet Take 1 Tablet (40 mg total) by mouth Once a day    pravastatin (PRAVACHOL) 20 mg Oral Tablet Take 1 Tablet (20 mg total) by mouth Once a day     Allergies   Allergen Reactions    Penicillins Rash    Zostavax [Zoster Vaccine Live (Pf)]        Family History:  Family Medical History:       Problem Relation  (Age of Onset)    Bone cancer Father    Congestive Heart Failure Maternal Grandmother    Diabetes Maternal Grandmother    Leukemia Mother    Lung Cancer Father    No Known Problems Sister, Brother, Maternal Aunt, Maternal Uncle, Paternal Aunt, Paternal Uncle, Maternal Grandfather, Paternal Grandmother, Paternal Grandfather, Daughter, Son, Other              Social History:   Social History     Tobacco Use   Smoking Status Never    Passive exposure: Never   Smokeless Tobacco Never     Social History     Substance and Sexual Activity   Alcohol Use Never     Social History     Occupational History    Not on file       Review  of Systems:  Review of systems as discussed in HPI    Problem List:  Patient Active Problem List   Diagnosis    Sleep apnea    Mixed hyperlipidemia    Hiatal hernia    GERD (gastroesophageal reflux disease)    Vitamin D deficiency       Physical Examination:  BP (!) 148/80   Pulse 80   Temp 36.6 C (97.9 F)   Resp 18   Ht 1.753 m (5' 9.02")   Wt 90.7 kg (200 lb)   SpO2 95%   BMI 29.52 kg/m       Physical Exam  Vitals and nursing note reviewed.   Constitutional:       General: She is not in acute distress.     Appearance: Normal appearance. She is well-developed, well-groomed and overweight. She is not ill-appearing.   HENT:      Head: Normocephalic and atraumatic.   Cardiovascular:      Rate and Rhythm: Normal rate.      Pulses: Normal pulses.      Heart sounds: Normal heart sounds, S1 normal and S2 normal.   Pulmonary:      Effort: Pulmonary effort is normal.      Breath sounds: Normal breath sounds.   Abdominal:      General: Abdomen is flat. Bowel sounds are normal.      Palpations: Abdomen is soft.   Musculoskeletal:      Cervical back: Neck supple.   Skin:     General: Skin is warm and dry.      Capillary Refill: Capillary refill takes less than 2 seconds.   Neurological:      General: No focal deficit present.      Mental Status: She is alert and oriented to person, place, and time.    Psychiatric:         Mood and Affect: Mood normal.         Behavior: Behavior is cooperative.        Data Reviewed:  Office Visit on 11/25/2022   Component Date Value Ref Range Status    SODIUM 11/25/2022 138  136 - 145 mmol/L Final    POTASSIUM 11/25/2022 3.8  3.5 - 5.1 mmol/L Final    CHLORIDE 11/25/2022 106  98 - 107 mmol/L Final    CO2 TOTAL 11/25/2022 27  21 - 31 mmol/L Final    ANION GAP 11/25/2022 5  4 - 13 mmol/L Final    CALCIUM 11/25/2022 9.2  8.6 - 10.3 mg/dL Final    GLUCOSE 54/12/8117 107  74 - 109 mg/dL Final    BUN 14/78/2956 8  7 - 25 mg/dL Final    CREATININE 21/30/8657 0.76  0.60 - 1.30 mg/dL Final    BUN/CREA RATIO 11/25/2022 11  6 - 22 Final    ESTIMATED GFR 11/25/2022 88  >59 mL/min/1.37m^2 Final    OSMOLALITY, CALCULATED 11/25/2022 275  270 - 290 mOsm/kg Final    URINE CULTURE 11/25/2022 10,000 CFU/mL Mixed Flora (A)   Final   Orders Only on 11/24/2022   Component Date Value Ref Range Status    VARICELLA-ZOSTER VIRUS (VZV) ANTIB* 11/20/2022 0.91   Final    TRIGLYCERIDES 11/20/2022 140   Final    CHOLESTEROL 11/20/2022 177   Final    HDL-CHOLESTEROL 11/20/2022 53   Final    LDL (CALCULATED) 11/20/2022 99   Final    VLDL (CALCULATED) 11/20/2022 25   Final   Admission  on 07/22/2022, Discharged on 07/22/2022   Component Date Value Ref Range Status    Final Diagnosis 07/22/2022    Final                    Value:Gastric antrum, biopsy:                          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                          Negative for goblet cell intestinal metaplasia.                          Negative for organisms morphologically consistent with Helicobacter pylori                           on H&E examination.    Clinical History 07/22/2022    Final                    Value:Procedure: EGD WITH BIOPSY; COLONOSCOPY                           Surgeon: Fidela Juneau, MD (Primary)                            Pre-op Diagnosis: GERD                          HIATAL HERNIA                          PERSONAL HISTORY OF POLYPS                           Post-op Diagnosis: Mild Gastritis                          Grade B Esophagitis                          Normal Colonoscopy                               Gross Description 07/22/2022    Final                    Value:A: Gastric                          Received labeled with the patient's name and gastric antrum and consists                           of a single portion of tan soft tissue which measures 0.3 cm in diameter.  T1.        Health Maintenance:  Health Maintenance   Topic Date Due    NonMedicare Preventative Exam  Never done    Pap smear/HPV  Never done    Influenza Vaccine (1) 12/06/2022    Shingles Vaccine (1 of 2) 10/20/2023 (Originally 03/18/2009)    Depression Screening  10/20/2023    Breast Cancer Screening  06/21/2024    Adult Tdap-Td (1 - Tdap) 10/02/2024    Colonoscopy  07/21/2032    Covid-19 Vaccine  Completed    Hepatitis C screening  Discontinued    HIV Screening  Discontinued        Assessment & Plan   Problem List Items Addressed This Visit    None  Visit Diagnoses       Follow-up exam    -  Primary    Relevant Orders    XR CHEST PA AND LATERAL    Hypertension, unspecified type  (Chronic)       History of pneumonia        Relevant Orders    XR CHEST PA AND LATERAL           Patient to have follow up chest xray to ensure resolve of pna.     Educated patient on DASH diet, instructed patient to get home blood pressure cuff and monitor at home and keep log to bring back to next appt. Patient is going to try lifestyle modification to work on lowering blood pressure and increase activity as able with her current limitations of her injury.        Follow up:  Return in about 2 months (around 02/08/2023), or if symptoms worsen or fail to improve. Keep f/u as scheduled with Dr. Katrinka Blazing.     This note was partially created using voice  recognition software and is inherently subject to errors including those of syntax and "sound alike " substitutions which may escape proof reading.  In such instances, original meaning may be extrapolated by contextual derivation.    Macario Golds, FNP-C  12/09/2022, 10:55

## 2022-12-11 ENCOUNTER — Other Ambulatory Visit: Payer: Self-pay

## 2022-12-11 ENCOUNTER — Encounter (INDEPENDENT_AMBULATORY_CARE_PROVIDER_SITE_OTHER): Payer: Self-pay

## 2022-12-11 ENCOUNTER — Inpatient Hospital Stay
Admission: RE | Admit: 2022-12-11 | Discharge: 2022-12-11 | Disposition: A | Discharge status: Home / Self Care | Payer: 59 | Source: Ambulatory Visit | Attending: NURSE PRACTITIONER

## 2022-12-11 DIAGNOSIS — Z8701 Personal history of pneumonia (recurrent): Secondary | ICD-10-CM | POA: Insufficient documentation

## 2022-12-11 DIAGNOSIS — Z09 Encounter for follow-up examination after completed treatment for conditions other than malignant neoplasm: Secondary | ICD-10-CM | POA: Insufficient documentation

## 2023-01-06 ENCOUNTER — Ambulatory Visit (INDEPENDENT_AMBULATORY_CARE_PROVIDER_SITE_OTHER): Payer: Self-pay | Admitting: Internal Medicine

## 2023-04-27 ENCOUNTER — Ambulatory Visit (INDEPENDENT_AMBULATORY_CARE_PROVIDER_SITE_OTHER): Payer: 59 | Admitting: Internal Medicine

## 2023-04-27 ENCOUNTER — Encounter (INDEPENDENT_AMBULATORY_CARE_PROVIDER_SITE_OTHER): Payer: Self-pay | Admitting: Internal Medicine

## 2023-04-27 ENCOUNTER — Other Ambulatory Visit: Payer: Self-pay

## 2023-04-27 VITALS — BP 133/79 | HR 79 | Ht 69.0 in | Wt 222.8 lb

## 2023-04-27 DIAGNOSIS — R739 Hyperglycemia, unspecified: Secondary | ICD-10-CM

## 2023-04-27 DIAGNOSIS — Z0000 Encounter for general adult medical examination without abnormal findings: Secondary | ICD-10-CM | POA: Insufficient documentation

## 2023-04-27 DIAGNOSIS — R5382 Chronic fatigue, unspecified: Secondary | ICD-10-CM

## 2023-04-27 DIAGNOSIS — H5509 Other forms of nystagmus: Secondary | ICD-10-CM | POA: Insufficient documentation

## 2023-04-27 DIAGNOSIS — K219 Gastro-esophageal reflux disease without esophagitis: Secondary | ICD-10-CM

## 2023-04-27 DIAGNOSIS — E782 Mixed hyperlipidemia: Secondary | ICD-10-CM

## 2023-04-27 DIAGNOSIS — Z13220 Encounter for screening for lipoid disorders: Secondary | ICD-10-CM

## 2023-04-27 DIAGNOSIS — G4733 Obstructive sleep apnea (adult) (pediatric): Secondary | ICD-10-CM

## 2023-04-27 MED ORDER — PRAVASTATIN 20 MG TABLET
20.0000 mg | ORAL_TABLET | Freq: Every day | ORAL | 3 refills | Status: DC
Start: 2023-04-27 — End: 2023-10-27

## 2023-04-27 NOTE — Addendum Note (Signed)
Addended byLacey Jensen on: 04/27/2023 05:19 PM     Modules accepted: Orders

## 2023-04-27 NOTE — Addendum Note (Signed)
Addended byLacey Jensen on: 04/27/2023 04:15 PM     Modules accepted: Orders

## 2023-04-27 NOTE — Progress Notes (Signed)
INTERNAL MEDICINE, [facility]  [address]  [place] [address]       Name: Cynthia Villanueva MRN:  [id number]   Date: 04/27/2023 Age: 63 y.o.       OUTPATIENT PROGRESS NOTE    Subjective:   Patient ID:  Cynthia Villanueva is a pleasant 63 y.o. female.    Chief Complaint: Follow Up 6 Months (Has been having eye movement with vision loss. Happens mostly when standing and will last about 10 to 15 seconds. Has had about 4 episodes. )      History of Present Illness:  Pt presents for a well adult exam.  The Comprehensive Health Assessment was completed by the patient and reviewed with them.    Diet:  well balanced  Activity level: active lifestyle  Sleep: Adequate sleep  Compliant with taking medications: No    Patient Active Problem List   Diagnosis    Sleep apnea    Mixed hyperlipidemia    Hiatal hernia    GERD (gastroesophageal reflux disease)    Vitamin D deficiency    Annual physical exam    Nystagmus, optokinetic       Acute issues:  complaints of what she describes as fluttering sensation of her eyes.  She states that this has been present for about the past 4 months and she has noticed episodes about weekly if not twice weekly.  She states that all of a sudden she will have problems where she is unable to focus with her eyes well and she states that her eyes feel like that they are fluttering.  She states that the best way to describe her vision changes with this has that objects around her are blurred as if she was driving fast past something.  These episodes last anywhere from 5-10 minutes and then spontaneously resolve.  She has not had any headaches with this.  She has had no changes with the medications with this.  She has had no noted nausea or vomiting diarrhea constipation hematochezia or melena.  She has had no fevers or chills.  She has never had an episode like this in the past.  She also has been complaining of having a tremor.  Her husband states that he has noticed a tremor that  has been present on occasion of her head and sometimes her hands.  She states that these are present and she is unaware of them and mostly other people will be aware.  She denies any significant tremors with actual activity and she denies any tremors associated with rest.  She has never had this before and she has unaware whether these are exacerbated by caffeine or stressors.      The history is provided by the patient.      Allergies:     Allergies   Allergen Reactions    Penicillins Rash    Zostavax [Zoster Vaccine Live (Pf)]          Medications:   famotidine (PEPCID) 40 mg Oral Tablet, Take 1 Tablet (40 mg total) by mouth Once a day  pravastatin (PRAVACHOL) 20 mg Oral Tablet, Take 1 Tablet (20 mg total) by mouth Once a day    No facility-administered medications prior to visit.        Immunization History:     Immunization History   Administered Date(s) Administered    Covid-19 Vaccine,Moderna Bivalent 01/01/2021    Covid-19 Vaccine,Moderna(Spikevax),36mcg/0.5mL,12 yr+,Seasonal 01/23/2022    Covid-19 Vaccine,Moderna,12 Years+ 04/28/2019, 05/26/2019, 02/15/2020,  07/17/2020    Diptheria & Tetanus Toxoid,Adsorbed, 37YRS & OLDER 10/03/2014    Flublok Influenza Vaccine, 69yrs+ 01/08/2020    H1n1 Flu Vaccine Injection (Admin) 03/14/2008    Influenza Vaccine, 6 month-adult 02/07/2022         Past Medical History:     Past Medical History:   Diagnosis Date    GERD (gastroesophageal reflux disease)     Hiatal hernia     Mixed hyperlipidemia     Sleep apnea     Vitamin D deficiency              Past Surgical History:     Past Surgical History:   Procedure Laterality Date    CESAREAN SECTION      COLONOSCOPY      07/22/22    ENDOMETRIAL ABLATION      2007    HX CESAREAN SECTION      HX ROTATOR CUFF REPAIR Right     HX TUBAL LIGATION      UPPER GASTROINTESTINAL ENDOSCOPY      07/22/22             Family History:     Family Medical History:       Problem Relation (Age of Onset)    Bone cancer Father    Congestive Heart Failure  Maternal Grandmother    Diabetes Maternal Grandmother    Leukemia Mother    Lung Cancer Father    No Known Problems Sister, Brother, Maternal Aunt, Maternal Uncle, Paternal Aunt, Paternal Uncle, Maternal Grandfather, Paternal Grandmother, Paternal Grandfather, Daughter, Son, Other                Social History:   Cynthia Villanueva  reports that she has never smoked. She has never been exposed to tobacco smoke. She has never used smokeless tobacco. She reports that she does not drink alcohol and does not use drugs.        Review of Systems:     Constitutional   Denies: Fatigue, Fever  Eyes   Denies: Ocular Pain/Tenderness, Changes in Vision  HENT   Denies: Nasal Congestion Hoarsness, Headaches, Vertigo/Dizziness  Cardiovascular:   Denies: Chest Pain, Dyspnea on Exertion, Lower Extremity Edema  Respiratory:   Denies: Shortness of Breath, Cough  Gastrointestinal:   Denies: Reflux, Diarrhea, Constipation, Abdominal Pain  Genitourinary:   Denies: Urgency, Frequency, Dysuria  Integument:   Denies: Rash  Neurologic:   Denies: Muscular Weakness, Memory Difficulties  Musculoskeletal:   Denies: Joint Pain  Endocrine:   Denies: Cold Intolerance, Heat Intolerance  Psychiatric:   Denies: Anxiety, Depression, Difficulty Sleeping  Heme-Lymph:   Denies: Easy Bruising, Lymph Node Enlargement or Tenderness      Objective:   Vitals:    Vitals:    04/27/23 1148   BP: 133/79   Pulse: 79   SpO2: 98%   Weight: 101 kg (222 lb 12.8 oz)   Height: 1.753 m (5\' 9" )   BMI: 32.9          Body mass index is 32.9 kg/m.      Constitutional: Alert, well developed, well nourished  HEENT:  Head: NC/AT    Eyes: Sclera anicteric, conjunctiva not injected    Ears: EAC normal, bilateral TMs clear    Nose: No discharge    Throat: MMM, posterior pharynx without erythema or exudate  Neck:   Supple with normal ROM, no cervical LAD, no thyromegaly, no JVD, no carotid bruits  Cardiovascular: RRR, normal S1/S2, no  murmurs/rubs/gallops  Pulmonary:  CTAB, equal air  entry, nonlabored, no wheezes/crackles/rhonchi  Abdomen:   NABS, NT/ND, soft, no HSM, no masses  Musculoskeletal:  No deformity, no injury, no edema  Neurological:   Alert, oriented x 3, no abnormal tone  Skin:     Warm, pink, dry, no rashes, no jaundice, no pallor, no cyanosis  Psychiatric:  Normal mood, affect, behavior, judgment, and thought content      Labs:     Office Visit on 11/25/2022   Component Date Value Ref Range Status    SODIUM 11/25/2022 138  136 - 145 mmol/L Final    POTASSIUM 11/25/2022 3.8  3.5 - 5.1 mmol/L Final    CHLORIDE 11/25/2022 106  98 - 107 mmol/L Final    CO2 TOTAL 11/25/2022 27  21 - 31 mmol/L Final    ANION GAP 11/25/2022 5  4 - 13 mmol/L Final    CALCIUM 11/25/2022 9.2  8.6 - 10.3 mg/dL Final    GLUCOSE 25/36/6440 107  74 - 109 mg/dL Final    BUN 34/74/2595 8  7 - 25 mg/dL Final    CREATININE 63/87/5643 0.76  0.60 - 1.30 mg/dL Final    BUN/CREA RATIO 11/25/2022 11  6 - 22 Final    ESTIMATED GFR 11/25/2022 88  >59 mL/min/1.69m^2 Final    OSMOLALITY, CALCULATED 11/25/2022 275  270 - 290 mOsm/kg Final    URINE CULTURE 11/25/2022 10,000 CFU/mL Mixed Flora (A)   Final   Orders Only on 11/24/2022   Component Date Value Ref Range Status    VARICELLA-ZOSTER VIRUS (VZV) ANTIB* 11/20/2022 0.91   Final    TRIGLYCERIDES 11/20/2022 140   Final    CHOLESTEROL 11/20/2022 177   Final    HDL-CHOLESTEROL 11/20/2022 53   Final    LDL (CALCULATED) 11/20/2022 99   Final    VLDL (CALCULATED) 11/20/2022 25   Final       Assessment & Plan:       ICD-10-CM    1. Annual physical exam  Z00.00 Exam performed    CBC     HGA1C (HEMOGLOBIN A1C WITH EST AVG GLUCOSE)     URINALYSIS, MACROSCOPIC AND MICROSCOPIC W/CULTURE REFLEX     COMPREHENSIVE METABOLIC PANEL, NON-FASTING     THYROID STIMULATING HORMONE WITH FREE T4 REFLEX     VITAMIN B6 (PYRIDOXAL 5-PHOSPHATE), PLASMA     NIACIN (VITAMIN B3), PLASMA      2. Obstructive sleep apnea syndrome  G47.33 The patient continues to use their CPAP every evening and is  compliant with its use.  They continue to benefit from CPAP use and has had improve lifestyle with this.       3. Gastroesophageal reflux disease without esophagitis  K21.9 Condition stable will continue current therapy.        4. 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  Her neurological exam cranial nerves 2-12 were grossly intact.  I am going to order an MRI of the brain as we are needing to have a very good look at the midbrain especially the pons and superior area of the pons and cerebellum.    VITAMIN B12     HGA1C (HEMOGLOBIN A1C WITH EST AVG GLUCOSE)     THYROID STIMULATING HORMONE WITH FREE T4 REFLEX     VITAMIN B6 (PYRIDOXAL 5-PHOSPHATE), PLASMA     NIACIN (VITAMIN B3), PLASMA  5. Chronic fatigue  R53.82 Labs ordered here and on this visit will be be addressed by me. I will contact patient and address therapy options once completed.     CBC     COMPREHENSIVE METABOLIC PANEL, NON-FASTING     THYROID STIMULATING HORMONE WITH FREE T4 REFLEX      6. Lipid screening  Z13.220 Labs ordered here and on this visit will be be addressed by me. I will contact patient and address therapy options once completed.     LIPID PANEL      7. Elevated blood sugar  R73.9 Labs ordered here and on this visit will be be addressed by me. I will contact patient and address therapy options once completed.     HGA1C (HEMOGLOBIN A1C WITH EST AVG GLUCOSE)      8. Hypocalcemia  E83.51 Labs ordered here and on this visit will be be addressed by me. I will contact patient and address therapy options once completed.     VITAMIN D 25 TOTAL        Orders Placed This Encounter    MRI BRAIN WO CONTRAST    CBC    LIPID PANEL    VITAMIN B12    HGA1C (HEMOGLOBIN A1C WITH EST AVG GLUCOSE)    URINALYSIS, MACROSCOPIC AND MICROSCOPIC W/CULTURE REFLEX    COMPREHENSIVE METABOLIC PANEL, NON-FASTING    THYROID  STIMULATING HORMONE WITH FREE T4 REFLEX    VITAMIN D 25 TOTAL    VITAMIN B6 (PYRIDOXAL 5-PHOSPHATE), PLASMA    NIACIN (VITAMIN B3), PLASMA     There are no discontinued medications.         Health Maintenance   Topic Date Due    Influenza Vaccine (1) 05/27/2023 (Originally 12/06/2022)    Shingles Vaccine (1 of 2) 10/20/2023 (Originally 03/18/2009)    Pneumococcal Vaccination, Age 30+ (1 of 1 - PCV) 04/26/2024 (Originally 03/18/2009)    Depression Screening  10/20/2023    Pap smear/HPV  03/18/2024    NonMedicare Preventative Exam  04/26/2024    Breast Cancer Screening  06/21/2024    Adult Tdap-Td (1 - Tdap) 10/02/2024    Colonoscopy  07/21/2032    Covid-19 Vaccine  Completed    Hepatitis C screening  Discontinued    HIV Screening  Discontinued         Return in about 6 weeks (around 06/08/2023).    The patient has been educated and verbalized understanding regarding the services provided during this visit.    Lucia Gaskins, DO  04/27/2023 16:11

## 2023-05-03 ENCOUNTER — Encounter (INDEPENDENT_AMBULATORY_CARE_PROVIDER_SITE_OTHER): Payer: Self-pay | Admitting: Internal Medicine

## 2023-05-03 ENCOUNTER — Other Ambulatory Visit (INDEPENDENT_AMBULATORY_CARE_PROVIDER_SITE_OTHER): Payer: Self-pay | Admitting: Internal Medicine

## 2023-05-03 DIAGNOSIS — H5509 Other forms of nystagmus: Secondary | ICD-10-CM

## 2023-05-17 ENCOUNTER — Ambulatory Visit (HOSPITAL_COMMUNITY): Payer: Self-pay

## 2023-05-25 ENCOUNTER — Encounter (INDEPENDENT_AMBULATORY_CARE_PROVIDER_SITE_OTHER): Payer: Self-pay | Admitting: Internal Medicine

## 2023-05-25 ENCOUNTER — Other Ambulatory Visit (INDEPENDENT_AMBULATORY_CARE_PROVIDER_SITE_OTHER): Payer: Self-pay | Admitting: Internal Medicine

## 2023-05-25 ENCOUNTER — Ambulatory Visit (INDEPENDENT_AMBULATORY_CARE_PROVIDER_SITE_OTHER): Payer: Self-pay | Admitting: Internal Medicine

## 2023-05-25 DIAGNOSIS — H5509 Other forms of nystagmus: Secondary | ICD-10-CM

## 2023-05-25 DIAGNOSIS — Z0000 Encounter for general adult medical examination without abnormal findings: Secondary | ICD-10-CM

## 2023-05-25 DIAGNOSIS — R5382 Chronic fatigue, unspecified: Secondary | ICD-10-CM

## 2023-05-25 DIAGNOSIS — R739 Hyperglycemia, unspecified: Secondary | ICD-10-CM

## 2023-05-25 DIAGNOSIS — N390 Urinary tract infection, site not specified: Secondary | ICD-10-CM

## 2023-05-25 DIAGNOSIS — Z13220 Encounter for screening for lipoid disorders: Secondary | ICD-10-CM

## 2023-05-25 LAB — LIPID PANEL
CHOLESTEROL: 197
HDL-CHOLESTEROL: 56
LDL (CALCULATED): 121
TRIGLYCERIDES: 115
VLDL (CALCULATED): 20

## 2023-05-25 LAB — HGA1C (HEMOGLOBIN A1C WITH EST AVG GLUCOSE): HEMOGLOBIN A1C: 5.7

## 2023-05-25 MED ORDER — CIPROFLOXACIN 500 MG TABLET
500.0000 mg | ORAL_TABLET | Freq: Two times a day (BID) | ORAL | 0 refills | Status: AC
Start: 2023-05-25 — End: 2023-06-04

## 2023-05-25 NOTE — Telephone Encounter (Signed)
Patient notified of urine culture results/antibiotic needed she voiced understanding ks,lpn

## 2023-06-10 ENCOUNTER — Encounter (INDEPENDENT_AMBULATORY_CARE_PROVIDER_SITE_OTHER): Payer: Self-pay | Admitting: Internal Medicine

## 2023-06-14 ENCOUNTER — Other Ambulatory Visit (INDEPENDENT_AMBULATORY_CARE_PROVIDER_SITE_OTHER): Payer: Self-pay | Admitting: Internal Medicine

## 2023-06-14 DIAGNOSIS — Z1231 Encounter for screening mammogram for malignant neoplasm of breast: Secondary | ICD-10-CM

## 2023-06-15 ENCOUNTER — Telehealth (INDEPENDENT_AMBULATORY_CARE_PROVIDER_SITE_OTHER): Payer: Self-pay | Admitting: Internal Medicine

## 2023-06-15 NOTE — Telephone Encounter (Signed)
 Pt notified mammogram on Thursday march 20 at 3:30. Referral mailed

## 2023-06-24 ENCOUNTER — Encounter (HOSPITAL_COMMUNITY): Payer: Self-pay

## 2023-06-24 ENCOUNTER — Ambulatory Visit
Admission: RE | Admit: 2023-06-24 | Discharge: 2023-06-24 | Disposition: A | Discharge status: Home / Self Care | Payer: Self-pay | Source: Ambulatory Visit | Attending: Internal Medicine | Admitting: Internal Medicine

## 2023-06-24 ENCOUNTER — Other Ambulatory Visit: Payer: Self-pay

## 2023-06-24 DIAGNOSIS — Z1231 Encounter for screening mammogram for malignant neoplasm of breast: Secondary | ICD-10-CM | POA: Insufficient documentation

## 2023-10-27 ENCOUNTER — Ambulatory Visit: Payer: 59 | Attending: Internal Medicine | Admitting: Internal Medicine

## 2023-10-27 ENCOUNTER — Other Ambulatory Visit: Payer: Self-pay

## 2023-10-27 ENCOUNTER — Encounter (INDEPENDENT_AMBULATORY_CARE_PROVIDER_SITE_OTHER): Payer: Self-pay | Admitting: Internal Medicine

## 2023-10-27 VITALS — BP 137/77 | HR 73 | Ht 69.0 in | Wt 224.0 lb

## 2023-10-27 DIAGNOSIS — I10 Essential (primary) hypertension: Secondary | ICD-10-CM | POA: Insufficient documentation

## 2023-10-27 DIAGNOSIS — D429 Neoplasm of uncertain behavior of meninges, unspecified: Secondary | ICD-10-CM | POA: Insufficient documentation

## 2023-10-27 DIAGNOSIS — G4733 Obstructive sleep apnea (adult) (pediatric): Secondary | ICD-10-CM | POA: Insufficient documentation

## 2023-10-27 DIAGNOSIS — K219 Gastro-esophageal reflux disease without esophagitis: Secondary | ICD-10-CM | POA: Insufficient documentation

## 2023-10-27 DIAGNOSIS — R5382 Chronic fatigue, unspecified: Secondary | ICD-10-CM | POA: Insufficient documentation

## 2023-10-27 DIAGNOSIS — E782 Mixed hyperlipidemia: Secondary | ICD-10-CM | POA: Insufficient documentation

## 2023-10-27 MED ORDER — FAMOTIDINE 40 MG TABLET
40.0000 mg | ORAL_TABLET | Freq: Every day | ORAL | 0 refills | Status: AC
Start: 2023-10-27 — End: 2024-01-25

## 2023-10-27 MED ORDER — PRAVASTATIN 20 MG TABLET
20.0000 mg | ORAL_TABLET | Freq: Every day | ORAL | 3 refills | Status: AC
Start: 2023-10-27 — End: ?

## 2023-10-27 NOTE — Progress Notes (Signed)
 INTERNAL MEDICINE, [facility]  [address]  [place] [address]  Operated by [facility]     Name: Cynthia Villanueva MRN:  [id number]   Date: 10/27/2023 Age: 63 y.o.       Chief Complaint:    Chief Complaint   Patient presents with    Follow Up 3 Months     Would like to discuss her visit with the Neurologist, and the brain tumors.  The report is in the chart.   Needs to discuss her labs from January.        History of Present Illness  Cynthia Villanueva is a 63 year old female with brain tumors who presents with vision changes and headaches.    She has two brain tumors located in areas unsuitable for surgery, which are being monitored due to lack of significant growth. Her last MRI at the end of March showed no significant change in the tumors.    She experiences episodes of vision changes described as seeing a 'whirl of colors' and difficulty reading charts. She can now recognize charts and people approaching, which she considers an improvement. She has had four episodes of vision changes since her last visit, each lasting about five to six seconds, and these are less severe than before. These episodes are not associated with headaches.    She experiences headaches that can last a couple of days and have worsened over time, described as severe enough to cause her to miss activities like church.    She has a history of a herniated disc but has not undergone surgery for it, as she functions adequately without it. She prefers to avoid surgeries unless absolutely necessary.    Her blood work shows a high hematocrit level of 48.6, while her hemoglobin is within the normal range.       Past Medical History:  Past Medical History:   Diagnosis Date    GERD (gastroesophageal reflux disease)     Hiatal hernia     Mixed hyperlipidemia     Sleep apnea     Vitamin D  deficiency          Past Surgical History:   Procedure Laterality Date    CESAREAN SECTION      COLONOSCOPY      07/22/22     ENDOMETRIAL ABLATION      2007    HX CESAREAN SECTION      HX ROTATOR CUFF REPAIR Right     HX TUBAL LIGATION      UPPER GASTROINTESTINAL ENDOSCOPY      07/22/22      Current Outpatient Medications   Medication Sig    famotidine  (PEPCID ) 40 mg Oral Tablet Take 1 Tablet (40 mg total) by mouth Daily for 90 days    pravastatin  (PRAVACHOL ) 20 mg Oral Tablet Take 1 Tablet (20 mg total) by mouth Daily     Allergies[1]    Family History:  Family Medical History:       Problem Relation (Age of Onset)    Bone cancer Father    Congestive Heart Failure Maternal Grandmother    Diabetes Maternal Grandmother    Leukemia Mother    Lung Cancer Father    No Known Problems Sister, Brother, Maternal Aunt, Maternal Uncle, Paternal Aunt, Paternal Uncle, Maternal Grandfather, Paternal Grandmother, Paternal Grandfather, Daughter, Son, Other              Social History:   Tobacco Use History[2]  Social  History     Substance and Sexual Activity   Alcohol Use Never     Social History     Occupational History    Not on file       Review of Systems:  Review of systems as discussed in HPI      Problem List:  Problem List[3]    Physical Examination:  BP 137/77 (Site: Left Arm, Patient Position: Sitting, Cuff Size: Adult)   Pulse 73   Ht 1.753 m (5' 9)   Wt 102 kg (224 lb)   SpO2 98%   BMI 33.08 kg/m       Physical Exam  Vitals and nursing note reviewed.   Constitutional:       General: She is not in acute distress.     Appearance: Normal appearance.   HENT:      Head: Normocephalic and atraumatic.      Right Ear: Tympanic membrane normal.      Left Ear: Tympanic membrane normal.      Nose: Nose normal.      Mouth/Throat:      Mouth: Mucous membranes are moist.      Pharynx: Oropharynx is clear.   Eyes:      General:         Right eye: No discharge.         Left eye: No discharge.      Conjunctiva/sclera: Conjunctivae normal.      Pupils: Pupils are equal, round, and reactive to light.   Cardiovascular:      Rate and Rhythm: Normal rate and  regular rhythm.      Pulses:           Carotid pulses are 0 on the right side and 0 on the left side.       Popliteal pulses are 2+ on the right side and 2+ on the left side.        Dorsalis pedis pulses are 2+ on the right side and 2+ on the left side.      Heart sounds: Normal heart sounds.   Pulmonary:      Effort: Pulmonary effort is normal.      Breath sounds: Normal breath sounds. No wheezing, rhonchi or rales.   Abdominal:      General: Bowel sounds are normal. There is no distension.      Palpations: Abdomen is soft.      Tenderness: There is no abdominal tenderness. There is no rebound.   Musculoskeletal:         General: No tenderness or signs of injury. Normal range of motion.      Cervical back: Normal range of motion. No tenderness.   Lymphadenopathy:      Cervical: No cervical adenopathy.   Skin:     General: Skin is warm.      Capillary Refill: Capillary refill takes less than 2 seconds.      Coloration: Skin is not jaundiced.   Neurological:      General: No focal deficit present.      Mental Status: She is alert and oriented to person, place, and time.      Cranial Nerves: No cranial nerve deficit.      Motor: No weakness.   Psychiatric:         Behavior: Behavior normal.            Health Maintenance:  Health Maintenance   Topic Date Due    Pneumococcal Vaccination, Age  50+ (1 of 1 - PCV) 04/26/2024 (Originally 03/18/2009)    Influenza Vaccine (1) 12/06/2023    Pap smear/HPV  03/18/2024    NonMedicare Preventative Exam  04/26/2024    Adult Tdap-Td (1 - Tdap) 10/02/2024    Depression Screening  10/26/2024    Breast Cancer Screening  06/23/2025    Colonoscopy  07/21/2032    Covid-19 Vaccine  Completed    Hepatitis C screening  Discontinued    HIV Screening  Discontinued    Shingles Vaccine  Discontinued        Results  LABS  CBC: Hemoglobin 15.2 g/dL, Hematocrit 51.3%    RADIOLOGY  Brain MRI: Two brain neoplasms in the frontoparietal region, no significant change from previous MRI (07/05/2023)          Computed FIB-4 Calculation unavailable. One or more values for this score either were not found within the given timeframe or did not fit some other criterion.     Assessment/Plan:      ICD-10-CM    1. Mixed hyperlipidemia  E78.2 pravastatin  (PRAVACHOL ) 20 mg Oral Tablet      2. 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 Obstructive sleep apnea syndrome  G47.33       4. Gastroesophageal reflux disease without esophagitis  K21.9       5. Chronic fatigue  R53.82 CBC/DIFF     COMPREHENSIVE METABOLIC PANEL, NON-FASTING      6. Hypertension, unspecified type  I10 CBC/DIFF     COMPREHENSIVE METABOLIC PANEL, NON-FASTING         Orders Placed This Encounter    MRI BRAIN WO CONTRAST    CBC/DIFF    COMPREHENSIVE METABOLIC PANEL, NON-FASTING    famotidine  (PEPCID ) 40 mg Oral Tablet    pravastatin  (PRAVACHOL ) 20 mg Oral Tablet     Medications Discontinued During This Encounter   Medication Reason    famotidine  (PEPCID ) 40 mg Oral Tablet Reorder    pravastatin  (PRAVACHOL ) 20 mg Oral Tablet Reorder        Depression screening is negative. PHQ 2 Total: 0                Assessment & Plan  Meningiomas  She has two benign brain tumors in non-operable locations due to poor surgical outcomes. The tumors are asymptomatic, with symptoms attributed to vestibular ocular dysfunction. Monitoring is essential as she is near the size threshold for intervention.  - Order MRI with contrast in September to monitor tumor size  - Repeat MRI every six months initially, then consider annual monitoring if stable    Vestibular Ocular Dysfunction  She experiences visual disturbances attributed to vestibular ocular dysfunction, not the meningiomas. Symptoms have improved, and ENT referral is unnecessary at this time.    Migraine with Aura  She reports headaches lasting days and brief visual disturbances, consistent with migraines with aura. These symptoms are new, and a medication trial will aid in diagnosis  confirmation.  - Prescribe Ubrelvy or Nurtec during headache or vision change episodes to confirm migraine diagnosis    General Health Maintenance  Her blood work shows elevated hematocrit with normal hemoglobin. The significance was explained, and reassurance provided. Periodic monitoring is advised.  - Repeat CBC at her convenience through [facility]    Follow-up  She requires follow-up for meningioma monitoring and migraine management.  - Schedule MRI with contrast in September  - Monitor migraine symptoms and adjust treatment as  necessary       Follow up:  No follow-ups on file.    This note was partially created using voice recognition software and is inherently subject to errors including those of syntax and sound alike  substitutions which may escape proof reading.  In such instances, original meaning may be extrapolated by contextual derivation.    This note was created with assistance from [employer] via capture of conversational audio. Consent was obtained from the patient and all parties present prior to recording.      Krystal DELENA Sharps, DO  10/27/2023, 10:50         [1]   Allergies  Allergen Reactions    Penicillins Rash    Zostavax [Zoster Vaccine Live (Pf)]    [2]   Social History  Tobacco Use   Smoking Status Never    Passive exposure: Never   Smokeless Tobacco Never   [3]   Patient Active Problem List  Diagnosis    Sleep apnea    Mixed hyperlipidemia    Hiatal hernia    GERD (gastroesophageal reflux disease)    Vitamin D  deficiency    Annual physical exam    Nystagmus, optokinetic    Neoplasm of uncertain behavior of meninges

## 2023-10-28 ENCOUNTER — Encounter (INDEPENDENT_AMBULATORY_CARE_PROVIDER_SITE_OTHER): Payer: Self-pay | Admitting: Internal Medicine

## 2024-01-03 ENCOUNTER — Ambulatory Visit (INDEPENDENT_AMBULATORY_CARE_PROVIDER_SITE_OTHER): Payer: Self-pay | Admitting: Internal Medicine

## 2024-01-03 ENCOUNTER — Telehealth (INDEPENDENT_AMBULATORY_CARE_PROVIDER_SITE_OTHER): Payer: Self-pay | Admitting: Internal Medicine

## 2024-01-03 ENCOUNTER — Other Ambulatory Visit (INDEPENDENT_AMBULATORY_CARE_PROVIDER_SITE_OTHER): Payer: Self-pay | Admitting: Internal Medicine

## 2024-01-03 DIAGNOSIS — D429 Neoplasm of uncertain behavior of meninges, unspecified: Secondary | ICD-10-CM

## 2024-01-03 NOTE — Telephone Encounter (Signed)
 Pt had MRI done at [facility] on 9.5.25, results are being scanned in, pt is concerned because there is no measurements done with this report. Can you call [facility] to ask about measurements and will pt still need to come in on 11.5.25. Please let pt know, thanks TM

## 2024-01-07 ENCOUNTER — Telehealth (INDEPENDENT_AMBULATORY_CARE_PROVIDER_SITE_OTHER): Payer: Self-pay | Admitting: Internal Medicine

## 2024-02-09 ENCOUNTER — Ambulatory Visit (INDEPENDENT_AMBULATORY_CARE_PROVIDER_SITE_OTHER): Payer: Self-pay | Admitting: Internal Medicine
